# Patient Record
Sex: Male | Born: 1985 | Race: White | Hispanic: No | Marital: Single | Smoking: Never smoker
Health system: Southern US, Community
[De-identification: ages and names within clinical notes are randomized; demographics above are authoritative.]

## PROBLEM LIST (undated history)

## (undated) DIAGNOSIS — H5340 Unspecified visual field defects: Secondary | ICD-10-CM

## (undated) DIAGNOSIS — D6851 Activated protein C resistance: Secondary | ICD-10-CM

## (undated) DIAGNOSIS — F418 Other specified anxiety disorders: Secondary | ICD-10-CM

## (undated) DIAGNOSIS — S069X9A Unspecified intracranial injury with loss of consciousness of unspecified duration, initial encounter: Secondary | ICD-10-CM

## (undated) HISTORY — DX: Unspecified visual field defects: H53.40

## (undated) HISTORY — DX: Activated protein C resistance: D68.51

## (undated) HISTORY — PX: TONSILLECTOMY: SUR1361

## (undated) HISTORY — DX: Unspecified intracranial injury with loss of consciousness of unspecified duration, initial encounter: S06.9X9A

## (undated) HISTORY — DX: Other specified anxiety disorders: F41.8

---
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
## 2004-12-04 DIAGNOSIS — H7292 Unspecified perforation of tympanic membrane, left ear: Secondary | ICD-10-CM

## 2004-12-04 HISTORY — DX: Unspecified perforation of tympanic membrane, left ear: H72.92

## 2005-04-02 ENCOUNTER — Emergency Department (HOSPITAL_COMMUNITY): Admission: EM | Admit: 2005-04-02 | Discharge: 2005-04-02 | Payer: Self-pay | Admitting: Family Medicine

## 2005-04-06 ENCOUNTER — Ambulatory Visit: Payer: Self-pay | Admitting: Internal Medicine

## 2006-04-04 ENCOUNTER — Ambulatory Visit: Payer: Self-pay | Admitting: Internal Medicine

## 2006-07-23 ENCOUNTER — Ambulatory Visit: Payer: Self-pay | Admitting: Internal Medicine

## 2009-05-06 ENCOUNTER — Encounter: Payer: Self-pay | Admitting: Internal Medicine

## 2009-08-20 ENCOUNTER — Ambulatory Visit: Payer: Self-pay | Admitting: Internal Medicine

## 2009-08-20 DIAGNOSIS — F418 Other specified anxiety disorders: Secondary | ICD-10-CM | POA: Insufficient documentation

## 2009-09-07 ENCOUNTER — Ambulatory Visit: Payer: Self-pay | Admitting: Internal Medicine

## 2009-09-14 ENCOUNTER — Encounter: Payer: Self-pay | Admitting: Internal Medicine

## 2009-09-14 ENCOUNTER — Telehealth (INDEPENDENT_AMBULATORY_CARE_PROVIDER_SITE_OTHER): Payer: Self-pay | Admitting: *Deleted

## 2009-10-26 ENCOUNTER — Ambulatory Visit: Payer: Self-pay | Admitting: Internal Medicine

## 2009-11-09 ENCOUNTER — Ambulatory Visit: Payer: Self-pay | Admitting: Internal Medicine

## 2009-11-22 LAB — CONVERTED CEMR LAB
Glucose, 1 Hour GTT: 69 mg/dL
Glucose, Fasting: 96 mg/dL (ref 70–99)
Glucose, GTT - 3 Hour: 65 mg/dL

## 2010-03-11 ENCOUNTER — Telehealth: Payer: Self-pay | Admitting: Internal Medicine

## 2010-03-11 DIAGNOSIS — D6851 Activated protein C resistance: Secondary | ICD-10-CM

## 2010-03-15 ENCOUNTER — Ambulatory Visit: Payer: Self-pay | Admitting: Internal Medicine

## 2010-03-15 DIAGNOSIS — M542 Cervicalgia: Secondary | ICD-10-CM | POA: Insufficient documentation

## 2010-04-20 ENCOUNTER — Ambulatory Visit: Payer: Self-pay | Admitting: Internal Medicine

## 2010-04-20 DIAGNOSIS — R292 Abnormal reflex: Secondary | ICD-10-CM

## 2010-04-20 LAB — CONVERTED CEMR LAB
Blood in Urine, dipstick: NEGATIVE
Glucose, Urine, Semiquant: NEGATIVE
Nitrite: NEGATIVE
Protein, U semiquant: NEGATIVE
Specific Gravity, Urine: <1.005
WBC Urine, dipstick: NEGATIVE

## 2010-05-04 ENCOUNTER — Encounter: Payer: Self-pay | Admitting: Internal Medicine

## 2010-10-10 ENCOUNTER — Ambulatory Visit: Payer: Self-pay | Admitting: Internal Medicine

## 2010-10-10 DIAGNOSIS — I861 Scrotal varices: Secondary | ICD-10-CM

## 2010-10-13 ENCOUNTER — Encounter: Admission: RE | Admit: 2010-10-13 | Discharge: 2010-10-13 | Payer: Self-pay | Admitting: Internal Medicine

## 2010-10-14 ENCOUNTER — Telehealth: Payer: Self-pay | Admitting: Internal Medicine

## 2010-10-19 ENCOUNTER — Ambulatory Visit: Payer: Self-pay | Admitting: Internal Medicine

## 2010-10-20 ENCOUNTER — Telehealth: Payer: Self-pay | Admitting: Internal Medicine

## 2010-10-21 ENCOUNTER — Ambulatory Visit (HOSPITAL_COMMUNITY)
Admission: RE | Admit: 2010-10-21 | Discharge: 2010-10-21 | Payer: Self-pay | Source: Home / Self Care | Admitting: Internal Medicine

## 2010-10-31 ENCOUNTER — Telehealth: Payer: Self-pay | Admitting: Internal Medicine

## 2010-11-14 ENCOUNTER — Encounter: Payer: Self-pay | Admitting: Internal Medicine

## 2010-11-16 ENCOUNTER — Telehealth: Payer: Self-pay | Admitting: Internal Medicine

## 2010-11-17 ENCOUNTER — Telehealth: Payer: Self-pay | Admitting: Internal Medicine

## 2010-11-24 ENCOUNTER — Ambulatory Visit (HOSPITAL_COMMUNITY)
Admission: RE | Admit: 2010-11-24 | Discharge: 2010-11-24 | Payer: Self-pay | Source: Home / Self Care | Attending: Internal Medicine | Admitting: Internal Medicine

## 2010-11-30 ENCOUNTER — Encounter
Admission: RE | Admit: 2010-11-30 | Discharge: 2011-01-03 | Payer: Self-pay | Source: Home / Self Care | Attending: Physical Medicine and Rehabilitation | Admitting: Physical Medicine and Rehabilitation

## 2010-12-01 ENCOUNTER — Encounter: Payer: Self-pay | Admitting: Internal Medicine

## 2010-12-21 ENCOUNTER — Telehealth: Payer: Self-pay | Admitting: Internal Medicine

## 2011-01-04 ENCOUNTER — Ambulatory Visit: Payer: 59 | Attending: Internal Medicine | Admitting: Rehabilitative and Restorative Service Providers"

## 2011-01-04 DIAGNOSIS — M546 Pain in thoracic spine: Secondary | ICD-10-CM | POA: Insufficient documentation

## 2011-01-04 DIAGNOSIS — M542 Cervicalgia: Secondary | ICD-10-CM | POA: Insufficient documentation

## 2011-01-04 DIAGNOSIS — IMO0001 Reserved for inherently not codable concepts without codable children: Secondary | ICD-10-CM | POA: Insufficient documentation
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
## 2011-01-05 NOTE — Letter (Signed)
Summary: [facility] MRI[facility]   Imported By: Lanelle Bal 05/11/2010 13:01:47  _____________________________________________________________________  External Attachment:    Type:   Image     Comment:   External Document

## 2011-01-05 NOTE — Progress Notes (Signed)
Summary: Referral request  Phone Note Call from Patient Call back at Home Phone [phone]   Summary of Call: Patient called noting that he would like referral to go to [facility]. Patient has seen [facility] and is aware he needs a nerve conduction study. Ok to enter referral? Initial call taken by: Lucious Groves CMA,  December 21, 2010 4:57 PM  Follow-up for Phone Call        no, test  recommneded  by Dr Ethelene Hal, he needs to order it  if he deems that [facility] E. Myles Mallicoat MD  December 21, 2010 8:08 PM   Additional Follow-up for Phone Call Additional follow up Details #1::        Patient notified. Additional Follow-up by: Lucious Groves CMA,  December 22, 2010 9:25 AM

## 2011-01-05 NOTE — Progress Notes (Signed)
Summary: fyi-- has FACTOR V DEFICIENCY  Phone Note Call from Patient   Caller: Mom Summary of Call: Mom here for CPX -- wanted to let us know that pt had tested positive for factor V liden.  His sister had a PE 11/2010 & test positive for factor V, protein S Shary Decamp  March 11, 2010 3:12 PM   New Problems: FACTOR V DEFICIENCY (ICD-286.3)   New Problems: FACTOR V DEFICIENCY (ICD-286.3)
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
## 2011-01-05 NOTE — Letter (Signed)
Summary: f/u 1 month, Dr Ethelene Hal ---[facility]   Imported By: Lanelle Bal 12/13/2010 08:41:54  _____________________________________________________________________  External Attachment:    Type:   Image     Comment:   External Document

## 2011-01-05 NOTE — Progress Notes (Signed)
Summary: wants MRI of back also  Phone Note Call from Patient Call back at Home Phone [phone]   Caller: Patient Summary of Call: patient has decided to get MRI of his back--has talked to his orthopedist  please give him a referral to the same [facility] location that he went to before for his neck MRI--please call him at [phone] Initial call taken by: Jerolyn Shin,  November 17, 2010 11:47 AM  Follow-up for Phone Call        Pls advise ok to order MRI of thoracic and is it with or w/o contrast. pls advise.Marland KitchenMarland KitchenMarland KitchenFelecia Deloach CMA  November 17, 2010 12:36 PM   w/o contrast [place] E. Paz MD  November 17, 2010 12:46 PM   Additional Follow-up for Phone Call Additional follow up Details #1::        Pt aware orders put in...........Marland KitchenFelecia Deloach CMA  November 17, 2010 1:10 PM

## 2011-01-05 NOTE — Progress Notes (Signed)
Summary: MRI   Phone Note Call from Patient Call back at Home Phone [phone]   Caller: Patient Summary of Call: I spoke with pt and he would like to know more detail about the MRI. He states that he is in severe pain so he knows that there has to be something wrong. Please advise. Army Fossa CMA  October 31, 2010 4:51 PM   Follow-up for Phone Call        MRI failed to show a problem that would explain  his pain. Needs to discuss w/ ortho Jose E. Paz MD  October 31, 2010 5:29 PM   Additional Follow-up for Phone Call Additional follow up Details #1::        Left message for pt to call back. Army Fossa CMA  November 01, 2010 8:19 AM     Additional Follow-up for Phone Call Additional follow up Details #2::    I spoke with pt he is aware. Army Fossa CMA  November 01, 2010 9:00 AM
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
## 2011-01-05 NOTE — Letter (Signed)
Summary: [facility]  [facility]   Imported By: Lanelle Bal 11/24/2010 11:19:10  _____________________________________________________________________  External Attachment:    Type:   Image     Comment:   External Document

## 2011-01-05 NOTE — Progress Notes (Signed)
Summary: Disability paperwork  Phone Note Call from Patient Call back at Home Phone [phone]   Summary of Call: Patient left message on triage that he needs to bring disability paper to the office.   Left message on voicemail notifying the patient to bring paperwork on Monday.   Initial call taken by: Lucious Groves CMA,  October 14, 2010 5:03 PM
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
## 2011-01-05 NOTE — Progress Notes (Signed)
Summary: Return Paper  Phone Note Outgoing Call   Summary of Call: Dr Drue Novel had given me a blank paper for the patient with note that said "Give back to patient". I called the patient to notify and patient said that the MD will not be doing paperwork for him and to shred it.  *Done Initial call taken by: Lucious Groves CMA,  October 20, 2010 4:58 PM

## 2011-01-09 ENCOUNTER — Ambulatory Visit: Payer: 59 | Admitting: Rehabilitative and Restorative Service Providers"

## 2011-01-11 ENCOUNTER — Encounter: Payer: Self-pay | Admitting: Rehabilitative and Restorative Service Providers"

## 2011-01-16 ENCOUNTER — Encounter: Payer: Self-pay | Admitting: Rehabilitative and Restorative Service Providers"

## 2011-01-19 ENCOUNTER — Encounter: Payer: Self-pay | Admitting: Rehabilitative and Restorative Service Providers"

## 2011-04-12 ENCOUNTER — Encounter: Payer: Self-pay | Admitting: Internal Medicine

## 2011-05-11 ENCOUNTER — Encounter: Payer: Self-pay | Admitting: Internal Medicine

## 2011-05-12 ENCOUNTER — Encounter: Payer: Self-pay | Admitting: Internal Medicine

## 2011-12-05 HISTORY — PX: VASECTOMY: SHX75

## 2013-03-26 ENCOUNTER — Ambulatory Visit: Payer: 59 | Admitting: Family Medicine

## 2013-04-02 ENCOUNTER — Ambulatory Visit (INDEPENDENT_AMBULATORY_CARE_PROVIDER_SITE_OTHER): Payer: BC Managed Care – PPO | Admitting: Internal Medicine

## 2013-04-02 ENCOUNTER — Encounter: Payer: Self-pay | Admitting: Internal Medicine

## 2013-04-02 VITALS — BP 118/78 | HR 57 | Wt 145.0 lb

## 2013-04-02 DIAGNOSIS — F418 Other specified anxiety disorders: Secondary | ICD-10-CM

## 2013-04-02 DIAGNOSIS — F341 Dysthymic disorder: Secondary | ICD-10-CM

## 2013-04-02 MED ORDER — CLONAZEPAM 0.5 MG PO TABS: 0.5000 mg | ORAL_TABLET | Freq: Two times a day (BID) | ORAL | Status: DC | PRN

## 2013-04-02 NOTE — Assessment & Plan Note (Signed)
This is a chronic issue, in 2011 we tried Effexor, Celexa and Zoloft, nothing brought clear relief. He had some thoughts of violence but clearly stated that he is not a violent person and has no plans to hurt himself or others. I talked about a counselor and he is not open to that idea, states that he had a lot of counseling before and didn't help. I recommend a psychiatrist referral, he is willing to go, I think that is necessary because 2 years ago I tried a number of meds without much success. Plan: Clonazepam as needed,warned about drowsiness. Psychiatry referral For further management Hotline for crisis provided.
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
## 2013-04-02 NOTE — Patient Instructions (Addendum)
Take the medicine as needed Call anytime  if you get worse  A hotline is available to you:  [phone]  or [phone]

## 2013-06-10 ENCOUNTER — Encounter: Payer: Self-pay | Admitting: Family Medicine

## 2013-06-10 ENCOUNTER — Ambulatory Visit (INDEPENDENT_AMBULATORY_CARE_PROVIDER_SITE_OTHER): Payer: BC Managed Care – PPO | Admitting: Family Medicine

## 2013-06-10 VITALS — BP 112/72 | HR 60 | Temp 98.0°F | Ht 67.5 in | Wt 146.0 lb

## 2013-06-10 DIAGNOSIS — Z23 Encounter for immunization: Secondary | ICD-10-CM

## 2013-06-10 DIAGNOSIS — Z0000 Encounter for general adult medical examination without abnormal findings: Secondary | ICD-10-CM

## 2013-06-10 DIAGNOSIS — D492 Neoplasm of unspecified behavior of bone, soft tissue, and skin: Secondary | ICD-10-CM

## 2013-06-10 DIAGNOSIS — R229 Localized swelling, mass and lump, unspecified: Secondary | ICD-10-CM

## 2013-06-10 NOTE — Assessment & Plan Note (Signed)
Anticipate verrucae, less likely seborrheic keratosis. Discussed treatment options. Will monitor for now, and update me if spreading or more bothersome for imiquimod, consider LN2 cryotherapy.

## 2013-06-10 NOTE — Addendum Note (Signed)
Addended by: Eliezer Bottom on: 06/10/2013 04:35 PM   Modules accepted: Orders

## 2013-06-10 NOTE — Patient Instructions (Addendum)
Tdap today Start hepatitis A series today. Good to meet you, call us with questions. Return at your convenience fasting for blood work.

## 2013-06-10 NOTE — Assessment & Plan Note (Signed)
Preventative protocols reviewed and updated unless pt declined. Discussed healthy diet and lifestyle. Hep A and Tdap today.
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
## 2016-08-01 ENCOUNTER — Emergency Department (HOSPITAL_COMMUNITY): Payer: Worker's Compensation

## 2016-08-01 ENCOUNTER — Emergency Department (HOSPITAL_COMMUNITY)
Admission: EM | Admit: 2016-08-01 | Discharge: 2016-08-01 | Disposition: A | Discharge status: Home / Self Care | Payer: Worker's Compensation | Attending: Emergency Medicine | Admitting: Emergency Medicine

## 2016-08-01 ENCOUNTER — Encounter (HOSPITAL_COMMUNITY): Payer: Self-pay | Admitting: Emergency Medicine

## 2016-08-01 DIAGNOSIS — M545 Low back pain, unspecified: Secondary | ICD-10-CM

## 2016-08-01 DIAGNOSIS — M5441 Lumbago with sciatica, right side: Secondary | ICD-10-CM

## 2016-08-01 MED ORDER — OXYCODONE-ACETAMINOPHEN 5-325 MG PO TABS: 1.0000 | ORAL_TABLET | ORAL | 0 refills | Status: DC | PRN

## 2016-08-01 MED ORDER — OXYCODONE-ACETAMINOPHEN 5-325 MG PO TABS
ORAL_TABLET | ORAL | Status: AC
  Filled 2016-08-01: qty 1

## 2016-08-01 MED ORDER — KETOROLAC TROMETHAMINE 30 MG/ML IJ SOLN
30.0000 mg | Freq: Once | INTRAMUSCULAR | Status: AC
  Administered 2016-08-01: 30 mg via INTRAVENOUS
  Filled 2016-08-01: qty 1

## 2016-08-01 MED ORDER — DEXAMETHASONE SODIUM PHOSPHATE 10 MG/ML IJ SOLN
10.0000 mg | Freq: Once | INTRAMUSCULAR | Status: AC
  Administered 2016-08-01: 10 mg via INTRAMUSCULAR
  Filled 2016-08-01: qty 1

## 2016-08-01 MED ORDER — FENTANYL CITRATE (PF) 100 MCG/2ML IJ SOLN
150.0000 ug | Freq: Once | INTRAMUSCULAR | Status: AC
  Administered 2016-08-01: 150 ug via INTRAVENOUS
  Filled 2016-08-01: qty 4

## 2016-08-01 MED ORDER — OXYCODONE-ACETAMINOPHEN 5-325 MG PO TABS
1.0000 | ORAL_TABLET | ORAL | Status: AC | PRN
  Administered 2016-08-01 (×2): 1 via ORAL
  Filled 2016-08-01: qty 1
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
## 2016-08-01 NOTE — ED Notes (Signed)
States not allergic to percocet.

## 2016-08-01 NOTE — ED Notes (Signed)
Pt ambulated 80 feet in the hall without difficulty at this time.  MD aware of pt's ambulation.  Pt provided a urine specimen at this time as well.  Specimen labelled at the bedside.  Pt in no apparent distress at this time.  Pt states that he feels ambulating "feels better than laying in a prone position."

## 2016-08-01 NOTE — ED Provider Notes (Signed)
Results for orders placed or performed in visit on 04/20/10  [facility] Lab  Result Value Ref Range   Glucose, Urine, Semiquant negative    Bilirubin Urine negative    Ketones, urine, test strip negative    Blood in Urine, dipstick negative    Protein, U semiquant negative    Urobilinogen, UA 0.2    Nitrite negative    WBC Urine, dipstick negative    Specific Gravity, Urine <1.005    pH 5.0    Mr Thoracic Spine Wo Contrast  Result Date: 08/01/2016 CLINICAL DATA:  Initial evaluation for acute low back pain following lifting and air conditioner Unit. Pain radiates to lower extremity. EXAM: MRI THORACIC AND LUMBAR SPINE WITHOUT CONTRAST TECHNIQUE: Multiplanar and multiecho pulse sequences of the thoracic and lumbar spine were obtained without intravenous contrast. COMPARISON:  None. FINDINGS: MRI THORACIC SPINE FINDINGS Alignment: Vertebral bodies normally aligned with preservation of the normal thoracic kyphosis. No listhesis. Vertebrae: Small chronic endplate Schmorl's node present at the superior endplate of T6 with associated minimal height loss. Vertebral body heights are otherwise maintained. No acute or subacute fracture. Signal intensity within the vertebral body bone marrow is normal. Few scattered benign hemangiomas noted, most notable within the T10 vertebral body. No worrisome osseous lesions. Cord:  Signal intensity within the thoracic spinal cord is normal. Paraspinal and other soft tissues: Paraspinous soft tissues demonstrate no acute abnormality. Visualized lungs are clear. Partially visualized visceral structures demonstrate no acute abnormality. Disc levels: Central disc protrusion at C6-7 indents and flattens the ventral thecal sac and abuts the thoracic spinal cord. Minimal cord flattening without cord signal changes. No significant stenosis. Tiny left paracentral disc protrusion at T7-8 without stenosis. No other significant degenerative changes within the thoracic spine. MRI  LUMBAR SPINE FINDINGS Segmentation: Normal segmentation. Lowest well-formed this is the L5-S1 level. Alignment: Vertebral bodies normally aligned with preservation of the normal lumbar lordosis. No listhesis. Vertebrae: Vertebral body heights well maintained. No acute or chronic fracture. Signal intensity within the vertebral body bone marrow is normal. No worrisome osseous lesions. Conus medullaris: Extends to the L1 level and appears normal. Paraspinal and other soft tissues: Paraspinous soft tissues demonstrate a normal appearance without acute abnormality. Disc levels: No significant degenerative changes are seen through the L4-5 level. L5-S1: Small central disc protrusion with slight caudad angulation. No significant stenosis or neural impingement. Foramina remain widely patent. IMPRESSION: MR THORACIC SPINE IMPRESSION 1. No acute traumatic injury within the thoracic spine. 2. Small disc protrusions at T6-7 and T7-8 without significant stenosis. 3. Otherwise normal appearance of the thoracic spine. MR LUMBAR SPINE IMPRESSION 1. No acute traumatic injury within the lumbar spine. 2. Small central disc protrusion at L5-S1 without significant stenosis or evidence for neural impingement. 3. Otherwise normal appearance of the lumbar spine. Electronically Signed   By: Jeannine Boga M.D.   On: 08/01/2016 18:45   Mr Lumbar Spine Wo Contrast  Result Date: 08/01/2016 CLINICAL DATA:  Initial evaluation for acute low back pain following lifting and air conditioner Unit. Pain radiates to lower extremity. EXAM: MRI THORACIC AND LUMBAR SPINE WITHOUT CONTRAST TECHNIQUE: Multiplanar and multiecho pulse sequences of the thoracic and lumbar spine were obtained without intravenous contrast. COMPARISON:  None. FINDINGS: MRI THORACIC SPINE FINDINGS Alignment: Vertebral bodies normally aligned with preservation of the normal thoracic kyphosis. No listhesis. Vertebrae: Small chronic endplate Schmorl's node present at the  superior endplate of T6 with associated minimal height loss. Vertebral body heights are otherwise maintained. No  acute or subacute fracture. Signal intensity within the vertebral body bone marrow is normal. Few scattered benign hemangiomas noted, most notable within the T10 vertebral body. No worrisome osseous lesions. Cord:  Signal intensity within the thoracic spinal cord is normal. Paraspinal and other soft tissues: Paraspinous soft tissues demonstrate no acute abnormality. Visualized lungs are clear. Partially visualized visceral structures demonstrate no acute abnormality. Disc levels: Central disc protrusion at C6-7 indents and flattens the ventral thecal sac and abuts the thoracic spinal cord. Minimal cord flattening without cord signal changes. No significant stenosis. Tiny left paracentral disc protrusion at T7-8 without stenosis. No other significant degenerative changes within the thoracic spine. MRI LUMBAR SPINE FINDINGS Segmentation: Normal segmentation. Lowest well-formed this is the L5-S1 level. Alignment: Vertebral bodies normally aligned with preservation of the normal lumbar lordosis. No listhesis. Vertebrae: Vertebral body heights well maintained. No acute or chronic fracture. Signal intensity within the vertebral body bone marrow is normal. No worrisome osseous lesions. Conus medullaris: Extends to the L1 level and appears normal. Paraspinal and other soft tissues: Paraspinous soft tissues demonstrate a normal appearance without acute abnormality. Disc levels: No significant degenerative changes are seen through the L4-5 level. L5-S1: Small central disc protrusion with slight caudad angulation. No significant stenosis or neural impingement. Foramina remain widely patent. IMPRESSION: MR THORACIC SPINE IMPRESSION 1. No acute traumatic injury within the thoracic spine. 2. Small disc protrusions at T6-7 and T7-8 without significant stenosis. 3. Otherwise normal appearance of the thoracic spine. MR  LUMBAR SPINE IMPRESSION 1. No acute traumatic injury within the lumbar spine. 2. Small central disc protrusion at L5-S1 without significant stenosis or evidence for neural impingement. 3. Otherwise normal appearance of the lumbar spine. Electronically Signed   By: Jeannine Boga M.D.   On: 08/01/2016 18:45   MRI does not show any significant spinal cord impingement. Patient is feeling better. He has no numbness. He still has pain to his low back with some radiation down his right leg. He has no numbness in his legs. He's able to ambulate. He has urinated. He does have some pain in his right shoulder going down his arm. He has some intermittent tingling in his right arm. This could be resulting from a nerve impingement in the shoulder. He has mild pain with range of motion of the shoulder. 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 I don't feel that it needs x-rays. He did not fall on it. He has no spinal tenderness to his cervical spine. He was given a referral to follow-up with orthopedics. He was given a shot of Decadron in the ED. He was given a prescription for Percocet to use at home.   Malvin Johns, MD 08/01/16 [phone]

## 2016-08-01 NOTE — ED Triage Notes (Signed)
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 Pain 10/10 sharp pain.

## 2016-08-01 NOTE — ED Notes (Signed)
Patient transported to MRI 

## 2016-08-01 NOTE — ED Notes (Signed)
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

## 2016-08-01 NOTE — ED Notes (Signed)
Pt enquired about a work note and restrictions upon his return to work.  Will notify MD of his requests.

## 2016-08-01 NOTE — ED Notes (Signed)
Pt provided with d/c instructions at this time. Pt verbalizes understanding of d/c instructions as well as follow up procedure after d/c.  Pt provided with RX for percocet.  Pt verbalizes understanding of RX directions. Pt in no apparent distress at this time. Pt ambulatory at time of d/c.

## 2017-02-01 ENCOUNTER — Other Ambulatory Visit: Payer: Self-pay | Admitting: Student

## 2017-02-01 DIAGNOSIS — M5412 Radiculopathy, cervical region: Secondary | ICD-10-CM

## 2017-02-01 DIAGNOSIS — M5416 Radiculopathy, lumbar region: Secondary | ICD-10-CM

## 2017-02-10 ENCOUNTER — Ambulatory Visit
Admission: RE | Admit: 2017-02-10 | Discharge: 2017-02-10 | Disposition: A | Discharge status: Home / Self Care | Payer: Worker's Compensation | Source: Ambulatory Visit | Attending: Student | Admitting: Student

## 2017-02-10 DIAGNOSIS — M5416 Radiculopathy, lumbar region: Secondary | ICD-10-CM

## 2017-02-10 DIAGNOSIS — M5412 Radiculopathy, cervical region: Secondary | ICD-10-CM

## 2017-03-15 IMAGING — MR MR LUMBAR SPINE W/O CM
4 of 5 series · 26 of 48 positions shown · non-contrast
Comparison: None.

CLINICAL DATA: Severe spinal pain. Lumbar radiculopathy. Pain
extends into both legs. Bilateral lower extremity weakness.

EXAM:
MRI LUMBAR SPINE WITHOUT CONTRAST
TECHNIQUE: Multiplanar, multisequence MR imaging of the lumbar spine was
performed. No intravenous contrast was administered.

[Series 3: T2 · sagittal · 4.0mm · 0.55mm/px · 6 of 13 slices shown (1 of 2)]
[im 1/13]
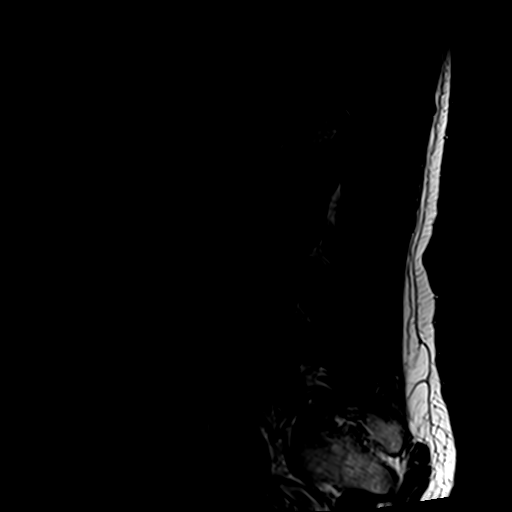
[im 3/13]
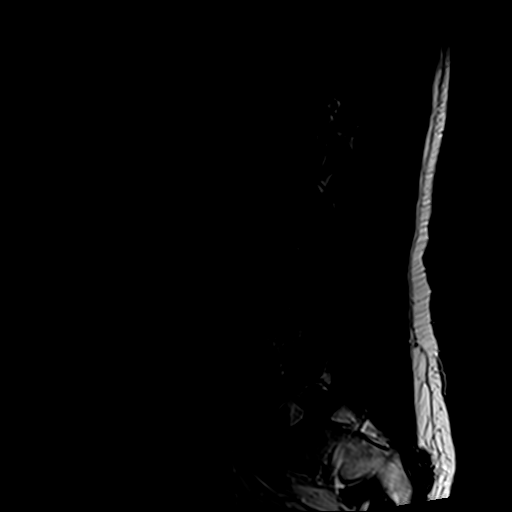
[im 5/13]
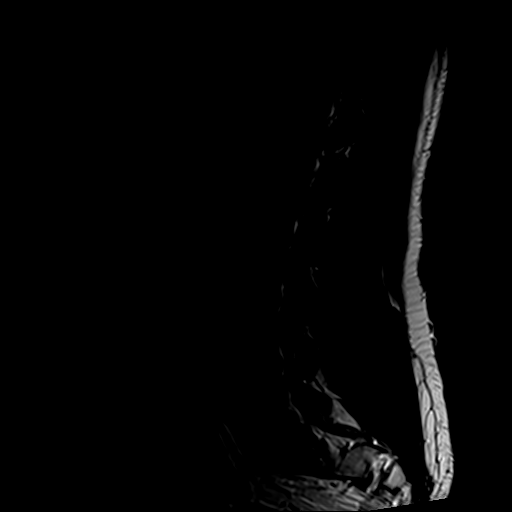
[im 8/13]
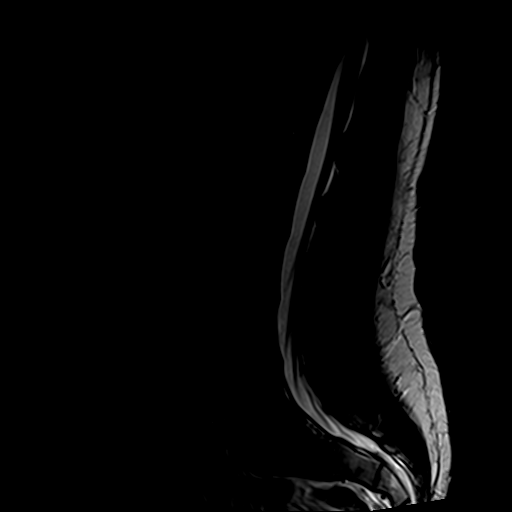
[im 10/13]
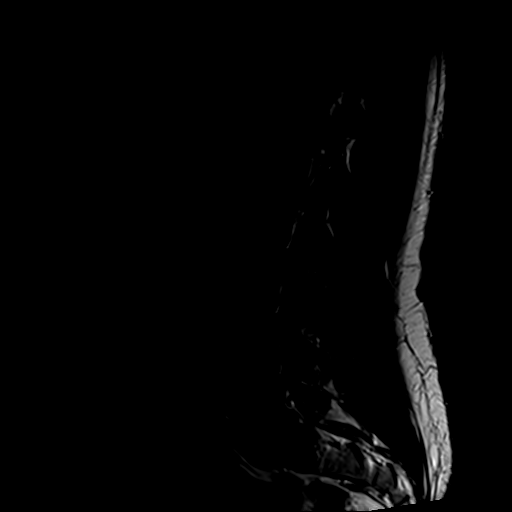
[im 13/13]
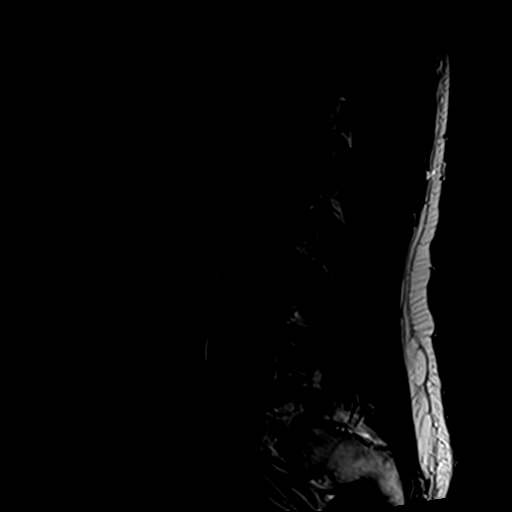

[Series 4: T1 · sagittal · 4.0mm · 0.55mm/px · 6 of 13 slices shown (1 of 2)]
[im 1/13]
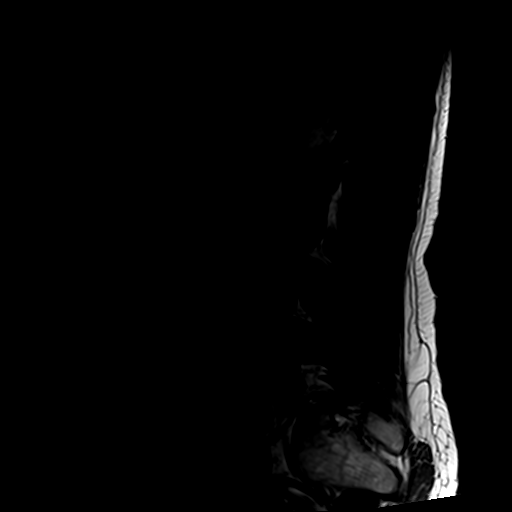
[im 3/13]
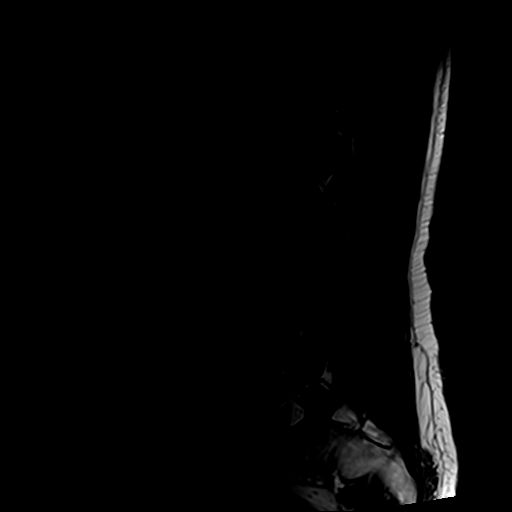
[im 5/13]
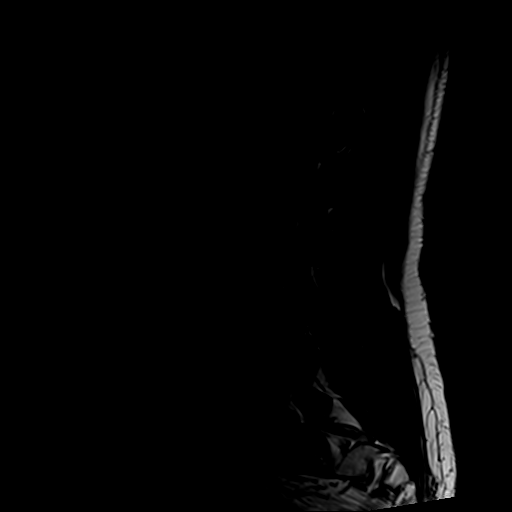
[im 8/13]
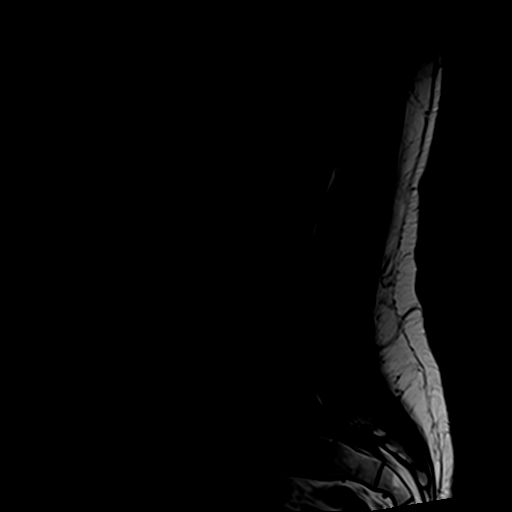
[im 10/13]
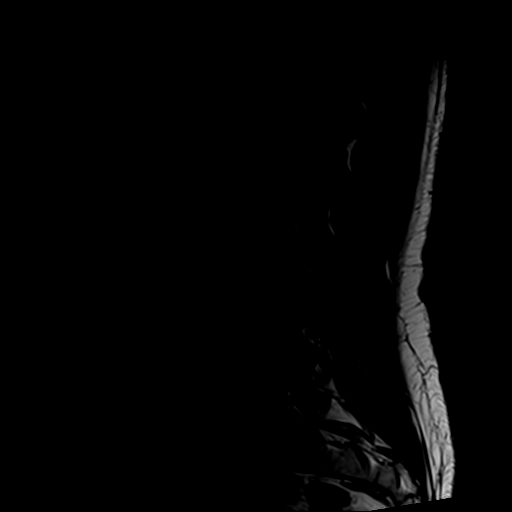
[im 13/13]
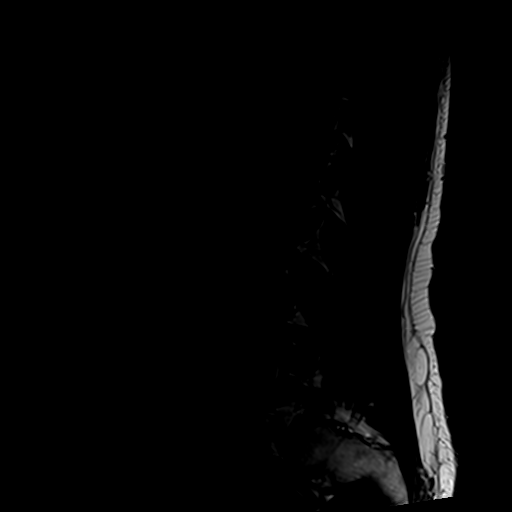

[Series 6: T2 · axial · 4.0mm · 0.70mm/px · z∈[-89,+101]mm · 9 of 33 slices shown (2 of 2)]
[im 1/33]
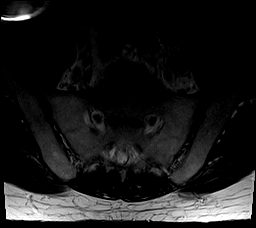
[im 5/33]
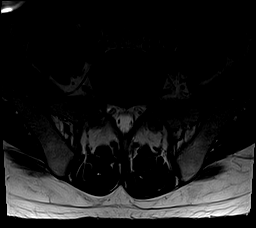
[im 10/33]
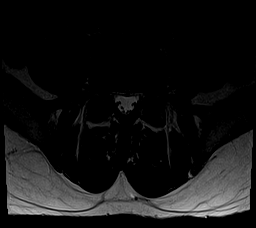
[im 14/33]
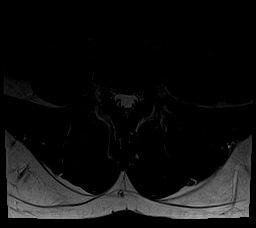
[im 17/33]
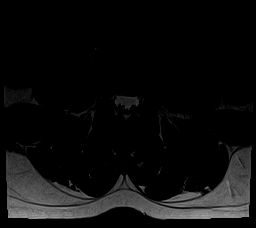
[im 19/33]
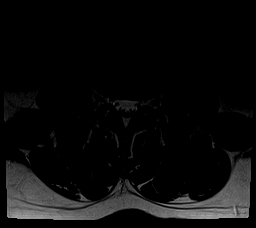
[im 23/33]
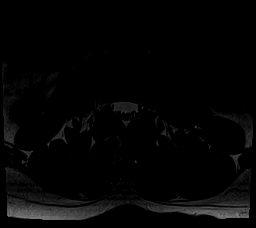
[im 28/33]
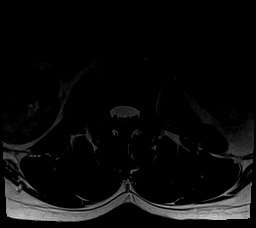
[im 33/33]
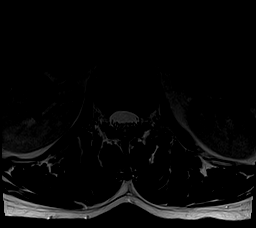

[Series 7: T1 · axial · 4.0mm · 0.35mm/px · z∈[-89,+75]mm · 5 of 33 slices shown (2 of 2)]
[im 1/33]
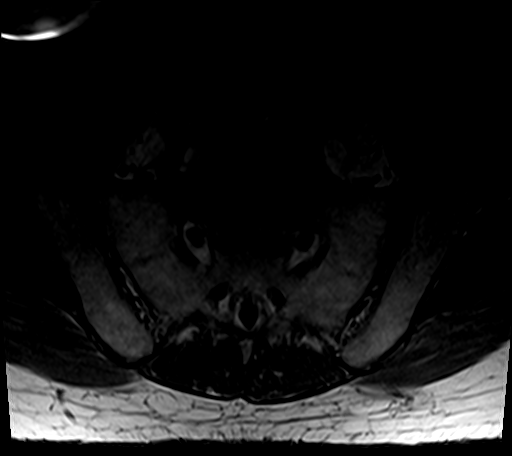
[im 5/33]
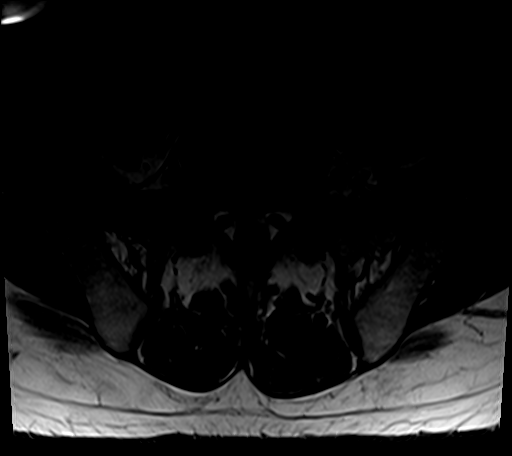
[im 10/33]
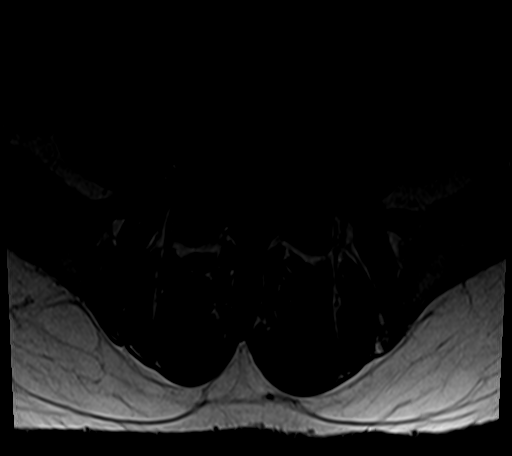
[im 17/33]
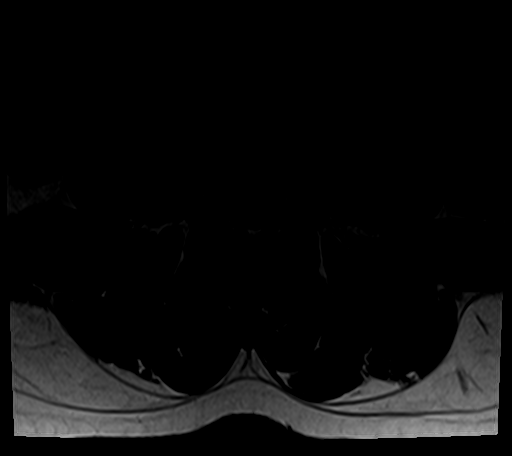
[im 28/33]
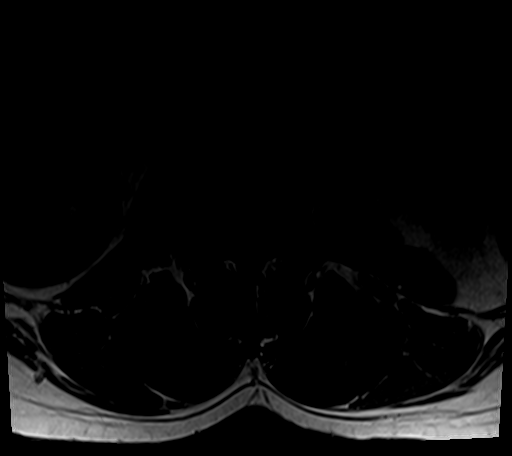

[26 of 48 positions shown; findings below may reference images not displayed]

FINDINGS: Segmentation: 5 non rib-bearing lumbar type vertebral bodies are
present.

Alignment: AP alignment is anatomic. No significant scoliosis is
present.

Vertebrae:  Marrow signal and vertebral body heights are normal.

Conus medullaris: Extends to the T12-L1: Level and appears normal.

Paraspinal and other soft tissues: Limited imaging the abdomen is
unremarkable.

Disc levels:

The disc levels at L1-2 and above are normal.

L3-4: Minimal disc bulging is present without significant stenosis.
Disc signal is preserved.

L4-5: Mild disc bulging is present bilaterally. There is no focal
stenosis.

L5-S1: A shallow central disc protrusion is present. There is no
focal stenosis.
IMPRESSION: 1. Mild disc disease at L3-4, L4-5, and L5-S1 without significant
focal stenosis.

## 2018-07-17 ENCOUNTER — Telehealth: Payer: Self-pay | Admitting: Family Medicine

## 2018-07-17 NOTE — Telephone Encounter (Signed)
Left message asking pt to call office    Dr. Darnell Level says if the pt means check up as in CPE, he will need a 30 min appt.  But if not, 15 min OV is fine   What is the reason for visit?

## 2018-07-18 NOTE — Telephone Encounter (Signed)
Left message asking pt to call office  °

## 2018-07-18 NOTE — Telephone Encounter (Signed)
Left message asking pt to call offfice °

## 2018-07-19 ENCOUNTER — Encounter: Payer: Self-pay | Admitting: Family Medicine

## 2018-07-19 ENCOUNTER — Ambulatory Visit: Payer: BC Managed Care – PPO | Admitting: Family Medicine

## 2018-07-19 VITALS — BP 114/72 | HR 74 | Temp 97.8°F | Ht 68.0 in | Wt 168.5 lb

## 2018-07-19 DIAGNOSIS — Z23 Encounter for immunization: Secondary | ICD-10-CM

## 2018-07-19 DIAGNOSIS — F418 Other specified anxiety disorders: Secondary | ICD-10-CM | POA: Diagnosis not present

## 2018-07-19 DIAGNOSIS — D6851 Activated protein C resistance: Secondary | ICD-10-CM

## 2018-07-19 DIAGNOSIS — T148XXA Other injury of unspecified body region, initial encounter: Secondary | ICD-10-CM | POA: Diagnosis not present

## 2018-07-19 NOTE — Assessment & Plan Note (Signed)
Chronic, stable on current regimen, followed closely by Dr Toy Care. Appreciate psych care.

## 2018-07-19 NOTE — Telephone Encounter (Signed)
Douglas Hughes  I wanted to let you know I have tried several time to call pt.

## 2018-07-19 NOTE — Addendum Note (Signed)
Addended by: Brenton Grills on: 2/48/2500 37:04 AM   Modules accepted: Orders

## 2018-07-19 NOTE — Patient Instructions (Addendum)
Good to see you today Hep A to complete series today Td today. You are doing well today Return as needed or in 1 year for a physical with labs.  Health Maintenance, Male A healthy lifestyle and preventive care is important for your health and wellness. Ask your health care provider about what schedule of regular examinations is right for you. What should I know about weight and diet? Eat a Healthy Diet  Eat plenty of vegetables, fruits, whole grains, low-fat dairy products, and lean protein.  Do not eat a lot of foods high in solid fats, added sugars, or salt.  Maintain a Healthy Weight Regular exercise can help you achieve or maintain a healthy weight. You should:  Do at least 150 minutes of exercise each week. The exercise should increase your heart rate and make you sweat (moderate-intensity exercise).  Do strength-training exercises at least twice a week.  Watch Your Levels of Cholesterol and Blood Lipids  Have your blood tested for lipids and cholesterol every 5 years starting at 33 years of age. If you are at high risk for heart disease, you should start having your blood tested when you are 33 years old. You may need to have your cholesterol levels checked more often if: ? Your lipid or cholesterol levels are high. ? You are older than 33 years of age. ? You are at high risk for heart disease.  What should I know about cancer screening? Many types of cancers can be detected early and may often be prevented. Lung Cancer  You should be screened every year for lung cancer if: ? You are a current smoker who has smoked for at least 30 years. ? You are a former smoker who has quit within the past 15 years.  Talk to your health care provider about your screening options, when you should start screening, and how often you should be screened.  Colorectal Cancer  Routine colorectal cancer screening usually begins at 33 years of age and should be repeated every 5-10 years until  you are 33 years old. You may need to be screened more often if early forms of precancerous polyps or small growths are found. Your health care provider may recommend screening at an earlier age if you have risk factors for colon cancer.  Your health care provider may recommend using home test kits to check for hidden blood in the stool.  A small camera at the end of a tube can be used to examine your colon (sigmoidoscopy or colonoscopy). This checks for the earliest forms of colorectal cancer.  Prostate and Testicular Cancer  Depending on your age and overall health, your health care provider may do certain tests to screen for prostate and testicular cancer.  Talk to your health care provider about any symptoms or concerns you have about testicular or prostate cancer.  Skin Cancer  Check your skin from head to toe regularly.  Tell your health care provider about any new moles or changes in moles, especially if: ? There is a change in a mole's size, shape, or color. ? You have a mole that is larger than a pencil eraser.  Always use sunscreen. Apply sunscreen liberally and repeat throughout the day.  Protect yourself by wearing long sleeves, pants, a wide-brimmed hat, and sunglasses when outside.  What should I know about heart disease, diabetes, and high blood pressure?  If you are 11-64 years of age, have your blood pressure checked every 3-5 years. If you are 40  years of age or older, have your blood pressure checked every year. You should have your blood pressure measured twice-once when you are at a hospital or clinic, and once when you are not at a hospital or clinic. Record the average of the two measurements. To check your blood pressure when you are not at a hospital or clinic, you can use: ? An automated blood pressure machine at a pharmacy. ? A home blood pressure monitor.  Talk to your health care provider about your target blood pressure.  If you are between 66-79 years  old, ask your health care provider if you should take aspirin to prevent heart disease.  Have regular diabetes screenings by checking your fasting blood sugar level. ? If you are at a normal weight and have a low risk for diabetes, have this test once every three years after the age of 22. ? If you are overweight and have a high risk for diabetes, consider being tested at a younger age or more often.  A one-time screening for abdominal aortic aneurysm (AAA) by ultrasound is recommended for men aged 71-75 years who are current or former smokers. What should I know about preventing infection? Hepatitis B If you have a higher risk for hepatitis B, you should be screened for this virus. Talk with your health care provider to find out if you are at risk for hepatitis B infection. Hepatitis C Blood testing is recommended for:  Everyone born from [birth year] through 1965.  Anyone with known risk factors for hepatitis C.  Sexually Transmitted Diseases (STDs)  You should be screened each year for STDs including gonorrhea and chlamydia if: ? You are sexually active and are younger than 33 years of age. ? You are older than 33 years of age and your health care provider tells you that you are at risk for this type of infection. ? Your sexual activity has changed since you were last screened and you are at an increased risk for chlamydia or gonorrhea. Ask your health care provider if you are at risk.  Talk with your health care provider about whether you are at high risk of being infected with HIV. Your health care provider may recommend a prescription medicine to help prevent HIV infection.  What else can I do?  Schedule regular health, dental, and eye exams.  Stay current with your vaccines (immunizations).  Do not use any tobacco products, such as cigarettes, chewing tobacco, and e-cigarettes. If you need help quitting, ask your health care provider.  Limit alcohol intake to no more than 2 drinks  per day. One drink equals 12 ounces of beer, 5 ounces of wine, or 1 ounces of hard liquor.  Do not use street drugs.  Do not share needles.  Ask your health care provider for help if you need support or information about quitting drugs.  Tell your health care provider if you often feel depressed.  Tell your health care provider if you have ever been abused or do not feel safe at home. This information is not intended to replace advice given to you by your health care provider. Make sure you discuss any questions you have with your health care provider. Document Released: 05/18/2008 Document Revised: 07/19/2016 Document Reviewed: 08/24/2015 Elsevier Interactive Patient Education  [employer].

## 2018-07-19 NOTE — Telephone Encounter (Signed)
Left message asking pt to call office  °

## 2018-07-19 NOTE — Telephone Encounter (Signed)
Noted. Pt seen in office today.

## 2018-07-19 NOTE — Assessment & Plan Note (Signed)
Discussed immunizations. Overall up to date. Will finish Hep A series today. Pt would like Td updated today - states he gets frequent cuts and scrapes with rusty metal at work. Latest cut was last week to R forearm.
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
## 2019-07-16 ENCOUNTER — Other Ambulatory Visit: Payer: Self-pay | Admitting: Family Medicine

## 2019-07-16 DIAGNOSIS — D6851 Activated protein C resistance: Secondary | ICD-10-CM

## 2019-07-16 DIAGNOSIS — Z13220 Encounter for screening for lipoid disorders: Secondary | ICD-10-CM

## 2019-07-16 DIAGNOSIS — Z131 Encounter for screening for diabetes mellitus: Secondary | ICD-10-CM

## 2019-07-18 ENCOUNTER — Other Ambulatory Visit: Payer: Self-pay

## 2019-07-21 ENCOUNTER — Other Ambulatory Visit: Payer: Self-pay

## 2019-07-23 ENCOUNTER — Encounter: Payer: Self-pay | Admitting: Family Medicine

## 2019-08-18 ENCOUNTER — Encounter: Payer: Self-pay | Admitting: Family Medicine

## 2019-08-18 ENCOUNTER — Other Ambulatory Visit: Payer: Self-pay

## 2019-08-18 ENCOUNTER — Ambulatory Visit (INDEPENDENT_AMBULATORY_CARE_PROVIDER_SITE_OTHER): Payer: BC Managed Care – PPO | Admitting: Family Medicine

## 2019-08-18 VITALS — BP 118/68 | HR 77 | Temp 98.1°F | Ht 67.0 in | Wt 169.2 lb

## 2019-08-18 DIAGNOSIS — Z23 Encounter for immunization: Secondary | ICD-10-CM

## 2019-08-18 DIAGNOSIS — Z0000 Encounter for general adult medical examination without abnormal findings: Secondary | ICD-10-CM | POA: Diagnosis not present

## 2019-08-18 DIAGNOSIS — F418 Other specified anxiety disorders: Secondary | ICD-10-CM

## 2019-08-18 DIAGNOSIS — Z13220 Encounter for screening for lipoid disorders: Secondary | ICD-10-CM | POA: Diagnosis not present

## 2019-08-18 DIAGNOSIS — Z131 Encounter for screening for diabetes mellitus: Secondary | ICD-10-CM | POA: Diagnosis not present

## 2019-08-18 DIAGNOSIS — D6851 Activated protein C resistance: Secondary | ICD-10-CM | POA: Diagnosis not present

## 2019-08-18 NOTE — Patient Instructions (Signed)
Flu shot today Labs today You are doing well today Continue healthy diet and lifestyle. Return as needed or in 1-2 years for next physical.   Health Maintenance, Male Adopting a healthy lifestyle and getting preventive care are important in promoting health and wellness. Ask your health care provider about:  The right schedule for you to have regular tests and exams.  Things you can do on your own to prevent diseases and keep yourself healthy. What should I know about diet, weight, and exercise? Eat a healthy diet   Eat a diet that includes plenty of vegetables, fruits, low-fat dairy products, and lean protein.  Do not eat a lot of foods that are high in solid fats, added sugars, or sodium. Maintain a healthy weight Body mass index (BMI) is a measurement that can be used to identify possible weight problems. It estimates body fat based on height and weight. Your health care provider can help determine your BMI and help you achieve or maintain a healthy weight. Get regular exercise Get regular exercise. This is one of the most important things you can do for your health. Most adults should:  Exercise for at least 150 minutes each week. The exercise should increase your heart rate and make you sweat (moderate-intensity exercise).  Do strengthening exercises at least twice a week. This is in addition to the moderate-intensity exercise.  Spend less time sitting. Even light physical activity can be beneficial. Watch cholesterol and blood lipids Have your blood tested for lipids and cholesterol at 34 years of age, then have this test every 5 years. You may need to have your cholesterol levels checked more often if:  Your lipid or cholesterol levels are high.  You are older than 34 years of age.  You are at high risk for heart disease. What should I know about cancer screening? Many types of cancers can be detected early and may often be prevented. Depending on your health history and  family history, you may need to have cancer screening at various ages. This may include screening for:  Colorectal cancer.  Prostate cancer.  Skin cancer.  Lung cancer. What should I know about heart disease, diabetes, and high blood pressure? Blood pressure and heart disease  High blood pressure causes heart disease and increases the risk of stroke. This is more likely to develop in people who have high blood pressure readings, are of African descent, or are overweight.  Talk with your health care provider about your target blood pressure readings.  Have your blood pressure checked: ? Every 3-5 years if you are 39-49 years of age. ? Every year if you are 83 years old or older.  If you are between the ages of 29 and 43 and are a current or former smoker, ask your health care provider if you should have a one-time screening for abdominal aortic aneurysm (AAA). Diabetes Have regular diabetes screenings. This checks your fasting blood sugar level. Have the screening done:  Once every three years after age 35 if you are at a normal weight and have a low risk for diabetes.  More often and at a younger age if you are overweight or have a high risk for diabetes. What should I know about preventing infection? Hepatitis B If you have a higher risk for hepatitis B, you should be screened for this virus. Talk with your health care provider to find out if you are at risk for hepatitis B infection. Hepatitis C Blood testing is recommended for:  Everyone born from [birth year] through 1966.  Anyone with known risk factors for hepatitis C. Sexually transmitted infections (STIs)  You should be screened each year for STIs, including gonorrhea and chlamydia, if: ? You are sexually active and are younger than 34 years of age. ? You are older than 34 years of age and your health care provider tells you that you are at risk for this type of infection. ? Your sexual activity has changed since you were  last screened, and you are at increased risk for chlamydia or gonorrhea. Ask your health care provider if you are at risk.  Ask your health care provider about whether you are at high risk for HIV. Your health care provider may recommend a prescription medicine to help prevent HIV infection. If you choose to take medicine to prevent HIV, you should first get tested for HIV. You should then be tested every 3 months for as long as you are taking the medicine. Follow these instructions at home: Lifestyle  Do not use any products that contain nicotine or tobacco, such as cigarettes, e-cigarettes, and chewing tobacco. If you need help quitting, ask your health care provider.  Do not use street drugs.  Do not share needles.  Ask your health care provider for help if you need support or information about quitting drugs. Alcohol use  Do not drink alcohol if your health care provider tells you not to drink.  If you drink alcohol: ? Limit how much you have to 0-2 drinks a day. ? Be aware of how much alcohol is in your drink. In the [place] one drink equals one 12 oz bottle of beer (355 mL), one 5 oz glass of wine (148 mL), or one 1 oz glass of hard liquor (44 mL). General instructions  Schedule regular health, dental, and eye exams.  Stay current with your vaccines.  Tell your health care provider if: ? You often feel depressed. ? You have ever been abused or do not feel safe at home. Summary  Adopting a healthy lifestyle and getting preventive care are important in promoting health and wellness.  Follow your health care provider's instructions about healthy diet, exercising, and getting tested or screened for diseases.  Follow your health care provider's instructions on monitoring your cholesterol and blood pressure. This information is not intended to replace advice given to you by your health care provider. Make sure you discuss any questions you have with your health care provider.  Document Released: 05/18/2008 Document Revised: 11/13/2018 Document Reviewed: 11/13/2018 Elsevier Patient Education  2020 [employer].

## 2019-08-18 NOTE — Assessment & Plan Note (Signed)
Preventative protocols reviewed and updated unless pt declined. Discussed healthy diet and lifestyle.  

## 2019-08-18 NOTE — Progress Notes (Signed)
This visit was conducted in person.  BP 118/68 (BP Location: Left Arm, Patient Position: Sitting, Cuff Size: Normal)   Pulse 77   Temp 98.1 F (36.7 C) (Temporal)   Ht 5\' 7"  (1.702 m)   Wt 169 lb 3 oz (76.7 kg)   SpO2 97%   BMI 26.50 kg/m    CC: CPE Subjective:    Patient ID: Douglas Hughes, male    DOB: June 11, 1986, 32 y.o.   MRN: [id number]  HPI: Douglas Hughes is a 33 y.o. 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  Followed by psychiatrist Dr Toy Care for depression/anxiety. Uses valium PRN, celexa daily.   Preventative: Flu shot today Tdap 2014, Td 2019 Seat belt use discussed Sunscreen use discussed, no changing moles on skin.  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 Interim medical history since our last visit reviewed. Allergies and medications reviewed and updated. Outpatient Medications Prior to Visit  Medication Sig Dispense Refill  . citalopram (CELEXA) 40 MG tablet Take 1 tablet by mouth daily.  12  . cyclobenzaprine (FLEXERIL) 10 MG tablet Take 1 tablet by mouth 2 (two) times daily as needed.  5  . diazepam (VALIUM) 10 MG tablet Take 10 mg by mouth at bedtime. As needed     No facility-administered medications prior to visit.      Per HPI unless specifically indicated in ROS section below Review of Systems  Constitutional: Negative for activity change, appetite change, chills, fatigue, fever and unexpected weight change.  HENT: Negative for hearing loss.   Eyes: Negative for visual disturbance.  Respiratory:  Negative for cough, chest tightness, shortness of breath and wheezing.   Cardiovascular: Negative for chest pain, palpitations and leg swelling.  Gastrointestinal: Negative for abdominal distention, abdominal pain, blood in stool, constipation, diarrhea, nausea and vomiting.  Genitourinary: Negative for difficulty urinating and hematuria.  Musculoskeletal: Negative for arthralgias, myalgias and neck pain.  Skin: Negative for rash.  Neurological: Negative for dizziness, seizures, syncope and headaches.  Hematological: Negative for adenopathy. Does not bruise/bleed easily.  Psychiatric/Behavioral: Negative for dysphoric mood. The patient is not nervous/anxious.    Objective:    BP 118/68 (BP Location: Left Arm, Patient Position: Sitting, Cuff Size: Normal)   Pulse 77   Temp 98.1 F (36.7 C) (Temporal)   Ht 5\' 7"  (1.702 m)   Wt 169 lb 3 oz (76.7 kg)   SpO2 97%   BMI 26.50 kg/m   Wt Readings from Last 3 Encounters:  08/18/19 169 lb 3 oz (76.7 kg)  07/19/18 168 lb 8 oz (76.4 kg)  08/01/16 150 lb (68 kg)    Physical Exam Vitals signs and nursing note reviewed.  Constitutional:      General: He is not in acute distress.    Appearance: Normal appearance. He is well-developed. He is not ill-appearing.  HENT:     Head: Normocephalic and atraumatic.     Right Ear: Hearing, tympanic membrane, ear canal and external ear normal.     Left Ear: Hearing, tympanic  membrane, ear canal and external ear normal.     Nose: Nose normal.     Mouth/Throat:     Mouth: Mucous membranes are moist.     Pharynx: Uvula midline. No oropharyngeal exudate or posterior oropharyngeal erythema.  Eyes:     General: No scleral icterus.    Extraocular Movements: Extraocular movements intact.     Conjunctiva/sclera: Conjunctivae normal.     Pupils: Pupils are equal, round, and reactive to light.  Neck:     Musculoskeletal: Normal range of motion and neck supple.  Cardiovascular:     Rate and Rhythm: Normal rate  and regular rhythm.     Pulses: Normal pulses.          Radial pulses are 2+ on the right side and 2+ on the left side.     Heart sounds: Normal heart sounds. No murmur.  Pulmonary:     Effort: Pulmonary effort is normal. No respiratory distress.     Breath sounds: Normal breath sounds. No wheezing, rhonchi or rales.  Abdominal:     General: Abdomen is flat. Bowel sounds are normal. There is no distension.     Palpations: Abdomen is soft. There is no mass.     Tenderness: There is no abdominal tenderness. There is no guarding or rebound.     Hernia: No hernia is present.  Musculoskeletal: Normal range of motion.  Lymphadenopathy:     Cervical: No cervical adenopathy.  Skin:    General: Skin is warm and dry.     Findings: No rash.  Neurological:     General: No focal deficit present.     Mental Status: He is alert and oriented to person, place, and time.     Comments: CN grossly intact, station and gait intact  Psychiatric:        Mood and Affect: Mood normal.        Behavior: Behavior normal.        Thought Content: Thought content normal.        Judgment: Judgment normal.       No results found for this or any previous visit. Assessment & Plan:   Problem List Items Addressed This Visit    Mixed anxiety and depressive disorder    Appreciate psych care Toy Care)      Healthcare maintenance - Primary    Preventative protocols reviewed and updated unless pt declined. Discussed healthy diet and lifestyle.       Factor V Leiden ([place])    Not on blood thinner.        Other Visit Diagnoses    Need for influenza vaccination       Relevant Orders   Flu Vaccine QUAD 36+ mos IM (Completed)   Diabetes mellitus screening       Relevant Orders   Basic metabolic panel   Lipid screening       Relevant Orders   Lipid panel       No orders of the defined types were placed in this encounter.  Orders Placed This Encounter  Procedures  . Flu Vaccine QUAD 36+ mos IM  . Lipid  panel  . Basic metabolic panel    Follow up plan: Return in about 1 year (around 08/17/2020) for annual exam, prior fasting for blood work.  Ria Bush, MD

## 2019-08-18 NOTE — Assessment & Plan Note (Signed)
Not on blood thinner 

## 2019-08-18 NOTE — Assessment & Plan Note (Signed)
Appreciate psych care Toy Care)

## 2019-08-19 LAB — BASIC METABOLIC PANEL
BUN: 12 mg/dL (ref 6–23)
CO2: 33 mEq/L — ABNORMAL HIGH (ref 19–32)
Calcium: 10 mg/dL (ref 8.4–10.5)
Chloride: 99 mEq/L (ref 96–112)
Creatinine, Ser: 0.91 mg/dL (ref 0.40–1.50)
GFR: 95.13 mL/min (ref >60.00)
Glucose, Bld: 80 mg/dL (ref 70–99)
Potassium: 4.8 mEq/L (ref 3.5–5.1)
Sodium: 138 mEq/L (ref 135–145)

## 2019-08-19 LAB — LIPID PANEL
Cholesterol: 217 mg/dL — ABNORMAL HIGH (ref 0–200)
HDL: 47.3 mg/dL (ref >39.00)
NonHDL: 169.61
Total CHOL/HDL Ratio: 5
Triglycerides: 201 mg/dL — ABNORMAL HIGH (ref 0.0–149.0)
VLDL: 40.2 mg/dL — ABNORMAL HIGH (ref 0.0–40.0)

## 2019-08-19 LAB — LDL CHOLESTEROL, DIRECT: Direct LDL: 123 mg/dL

## 2020-09-13 ENCOUNTER — Encounter: Payer: BC Managed Care – PPO | Admitting: Family Medicine

## 2020-09-22 ENCOUNTER — Encounter: Payer: Self-pay | Admitting: Family Medicine

## 2020-09-22 ENCOUNTER — Other Ambulatory Visit: Payer: Self-pay

## 2020-09-22 ENCOUNTER — Ambulatory Visit (INDEPENDENT_AMBULATORY_CARE_PROVIDER_SITE_OTHER): Payer: BC Managed Care – PPO | Admitting: Family Medicine

## 2020-09-22 VITALS — BP 108/70 | HR 71 | Temp 98.3°F | Ht 67.75 in | Wt 181.6 lb

## 2020-09-22 DIAGNOSIS — Z0000 Encounter for general adult medical examination without abnormal findings: Secondary | ICD-10-CM | POA: Diagnosis not present

## 2020-09-22 DIAGNOSIS — R079 Chest pain, unspecified: Secondary | ICD-10-CM | POA: Diagnosis not present

## 2020-09-22 DIAGNOSIS — F418 Other specified anxiety disorders: Secondary | ICD-10-CM | POA: Diagnosis not present

## 2020-09-22 DIAGNOSIS — E785 Hyperlipidemia, unspecified: Secondary | ICD-10-CM

## 2020-09-22 NOTE — Patient Instructions (Addendum)
Good to see you today You are doing well. Return as needed or in 1 year for next physical  Return at your convenience for fasting labs (4 hours) - may schedule up front. May come in at 4pm one day.   Health Maintenance, Male Adopting a healthy lifestyle and getting preventive care are important in promoting health and wellness. Ask your health care provider about:  The right schedule for you to have regular tests and exams.  Things you can do on your own to prevent diseases and keep yourself healthy. What should I know about diet, weight, and exercise? Eat a healthy diet   Eat a diet that includes plenty of vegetables, fruits, low-fat dairy products, and lean protein.  Do not eat a lot of foods that are high in solid fats, added sugars, or sodium. Maintain a healthy weight Body mass index (BMI) is a measurement that can be used to identify possible weight problems. It estimates body fat based on height and weight. Your health care provider can help determine your BMI and help you achieve or maintain a healthy weight. Get regular exercise Get regular exercise. This is one of the most important things you can do for your health. Most adults should:  Exercise for at least 150 minutes each week. The exercise should increase your heart rate and make you sweat (moderate-intensity exercise).  Do strengthening exercises at least twice a week. This is in addition to the moderate-intensity exercise.  Spend less time sitting. Even light physical activity can be beneficial. Watch cholesterol and blood lipids Have your blood tested for lipids and cholesterol at 35 years of age, then have this test every 5 years. You may need to have your cholesterol levels checked more often if:  Your lipid or cholesterol levels are high.  You are older than 35 years of age.  You are at high risk for heart disease. What should I know about cancer screening? Many types of cancers can be detected early and may  often be prevented. Depending on your health history and family history, you may need to have cancer screening at various ages. This may include screening for:  Colorectal cancer.  Prostate cancer.  Skin cancer.  Lung cancer. What should I know about heart disease, diabetes, and high blood pressure? Blood pressure and heart disease  High blood pressure causes heart disease and increases the risk of stroke. This is more likely to develop in people who have high blood pressure readings, are of African descent, or are overweight.  Talk with your health care provider about your target blood pressure readings.  Have your blood pressure checked: ? Every 3-5 years if you are 103-69 years of age. ? Every year if you are 72 years old or older.  If you are between the ages of 12 and 25 and are a current or former smoker, ask your health care provider if you should have a one-time screening for abdominal aortic aneurysm (AAA). Diabetes Have regular diabetes screenings. This checks your fasting blood sugar level. Have the screening done:  Once every three years after age 93 if you are at a normal weight and have a low risk for diabetes.  More often and at a younger age if you are overweight or have a high risk for diabetes. What should I know about preventing infection? Hepatitis B If you have a higher risk for hepatitis B, you should be screened for this virus. Talk with your health care provider to find out  if you are at risk for hepatitis B infection. Hepatitis C Blood testing is recommended for:  Everyone born from [birth year] through 1966.  Anyone with known risk factors for hepatitis C. Sexually transmitted infections (STIs)  You should be screened each year for STIs, including gonorrhea and chlamydia, if: ? You are sexually active and are younger than 35 years of age. ? You are older than 35 years of age and your health care provider tells you that you are at risk for this type of  infection. ? Your sexual activity has changed since you were last screened, and you are at increased risk for chlamydia or gonorrhea. Ask your health care provider if you are at risk.  Ask your health care provider about whether you are at high risk for HIV. Your health care provider may recommend a prescription medicine to help prevent HIV infection. If you choose to take medicine to prevent HIV, you should first get tested for HIV. You should then be tested every 3 months for as long as you are taking the medicine. Follow these instructions at home: Lifestyle  Do not use any products that contain nicotine or tobacco, such as cigarettes, e-cigarettes, and chewing tobacco. If you need help quitting, ask your health care provider.  Do not use street drugs.  Do not share needles.  Ask your health care provider for help if you need support or information about quitting drugs. Alcohol use  Do not drink alcohol if your health care provider tells you not to drink.  If you drink alcohol: ? Limit how much you have to 0-2 drinks a day. ? Be aware of how much alcohol is in your drink. In the [place] one drink equals one 12 oz bottle of beer (355 mL), one 5 oz glass of wine (148 mL), or one 1 oz glass of hard liquor (44 mL). General instructions  Schedule regular health, dental, and eye exams.  Stay current with your vaccines.  Tell your health care provider if: ? You often feel depressed. ? You have ever been abused or do not feel safe at home. Summary  Adopting a healthy lifestyle and getting preventive care are important in promoting health and wellness.  Follow your health care provider's instructions about healthy diet, exercising, and getting tested or screened for diseases.  Follow your health care provider's instructions on monitoring your cholesterol and blood pressure. This information is not intended to replace advice given to you by your health care provider. Make sure you  discuss any questions you have with your health care provider. Document Revised: 11/13/2018 Document Reviewed: 11/13/2018 Elsevier Patient Education  2020 [employer].

## 2020-09-22 NOTE — Assessment & Plan Note (Signed)
Will return when fasting for labwork.

## 2020-09-22 NOTE — Assessment & Plan Note (Signed)
Anticipate stress vs caffeine related - rec decrease caffeine intake and update if persistent.

## 2020-09-22 NOTE — Assessment & Plan Note (Signed)
Followed by psychiatrist Toy Care)

## 2020-09-22 NOTE — Progress Notes (Signed)
This visit was conducted in person.  BP 108/70 (BP Location: Left Arm, Patient Position: Sitting, Cuff Size: Normal)   Pulse 71   Temp 98.3 F (36.8 C) (Temporal)   Ht 5' 7.75" (1.721 m)   Wt 181 lb 9 oz (82.4 kg)   SpO2 98%   BMI 27.81 kg/m    CC: CPE Subjective:    Patient ID: Douglas Hughes, male    DOB: [birth year], 34 y.o.   MRN: [id number]  HPI: Douglas Hughes is a 34 y.o. male presenting on 09/22/2020 for Annual Exam and Chest Pain (C/o constant left left side ches pain. Not having today.  Started about 2 wks ago. )   Followed by psychiatrist Dr Toy Care for depression/anxiety. Uses valium TID PRN, now off celexa and on cymbalta with remeron for sleep. 12 lb weight gain from last year.   2 mo wk h/o sharp pains to L chest, can last all morning. Doesn't think stress related. Denies nausea, dyspnea, diaphoresis, radiation to jaw or down arm. No inciting etiology noted. Drinking 5-6 + cups coffee/day.   Preventative: Flu shot yearly  Tdap 2014, Td 2019  [place] 02/2020 x2, 08/2020  Seat belt use discussed Sunscreen use discussed, no changing moles on skin.  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 Interim medical history since our last visit reviewed. Allergies and medications reviewed and updated. Outpatient Medications Prior to Visit  Medication Sig Dispense Refill  . diazepam (VALIUM) 10 MG tablet Take 10 mg by mouth at bedtime. As needed    . DULoxetine (CYMBALTA) 30 MG capsule Take 30 mg by mouth every morning.    . mirtazapine (REMERON) 30 MG tablet  Take 30 mg by mouth at bedtime.    . citalopram (CELEXA) 40 MG tablet Take 1 tablet by mouth daily.  12  . cyclobenzaprine (FLEXERIL) 10 MG tablet Take 1 tablet by mouth 2 (two) times daily as needed.  5   No facility-administered medications prior to visit.     Per HPI unless specifically indicated in ROS section below Review of Systems  Constitutional: Negative for activity change, appetite change, chills, fatigue, fever and unexpected weight change.  HENT: Negative for hearing loss.   Eyes: Negative for visual disturbance.  Respiratory: Negative for cough, chest tightness, shortness of breath and wheezing.   Cardiovascular: Positive for chest pain. Negative for palpitations and leg swelling.  Gastrointestinal: Positive for constipation (PRN laxative). Negative for abdominal distention, abdominal pain, blood in stool, diarrhea, nausea and vomiting.  Genitourinary: Negative for difficulty urinating and hematuria.  Musculoskeletal: Negative for arthralgias, myalgias and neck pain.  Skin: Negative for rash.  Neurological: Negative for dizziness, seizures, syncope and headaches.  Hematological: Negative for adenopathy. Does not bruise/bleed easily.  Psychiatric/Behavioral: Negative for dysphoric mood. The patient is not nervous/anxious.    Objective:  BP 108/70 (BP Location: Left Arm, Patient Position: Sitting, Cuff Size: Normal)   Pulse 71   Temp 98.3 F (36.8 C) (Temporal)   Ht 5' 7.75" (1.721 m)   Wt 181 lb 9 oz (82.4 kg)  SpO2 98%   BMI 27.81 kg/m   Wt Readings from Last 3 Encounters:  09/22/20 181 lb 9 oz (82.4 kg)  08/18/19 169 lb 3 oz (76.7 kg)  07/19/18 168 lb 8 oz (76.4 kg)      Physical Exam Vitals and nursing note reviewed.  Constitutional:      General: He is not in acute distress.    Appearance: Normal appearance. He is not ill-appearing.  HENT:     Head: Normocephalic and atraumatic.     Right Ear: Hearing, tympanic membrane, ear canal and external ear  normal.     Left Ear: Hearing, tympanic membrane, ear canal and external ear normal.  Eyes:     General: No scleral icterus.    Extraocular Movements: Extraocular movements intact.     Conjunctiva/sclera: Conjunctivae normal.     Pupils: Pupils are equal, round, and reactive to light.  Neck:     Thyroid: No thyroid mass or thyromegaly.  Cardiovascular:     Rate and Rhythm: Normal rate and regular rhythm.     Pulses: Normal pulses.          Radial pulses are 2+ on the right side and 2+ on the left side.     Heart sounds: Normal heart sounds. No murmur heard.   Pulmonary:     Effort: Pulmonary effort is normal. No respiratory distress.     Breath sounds: Normal breath sounds. No wheezing, rhonchi or rales.  Abdominal:     General: Abdomen is flat. Bowel sounds are normal. There is no distension.     Palpations: Abdomen is soft. There is no mass.     Tenderness: There is no abdominal tenderness. There is no guarding or rebound.     Hernia: No hernia is present.  Musculoskeletal:        General: Normal range of motion.     Cervical back: Normal range of motion and neck supple.     Right lower leg: No edema.  Lymphadenopathy:     Cervical: No cervical adenopathy.  Skin:    General: Skin is warm and dry.     Findings: No rash.  Neurological:     General: No focal deficit present.     Mental Status: He is alert and oriented to person, place, and time.     Comments: CN grossly intact, station and gait intact  Psychiatric:        Mood and Affect: Mood normal.        Behavior: Behavior normal.        Thought Content: Thought content normal.        Judgment: Judgment normal.       Results for orders placed or performed in visit on 08/18/19  Lipid panel  Result Value Ref Range   Cholesterol 217 (H) 0 - 200 mg/dL   Triglycerides 201.0 (H) 0 - 149 mg/dL   HDL 47.30 >39.00 mg/dL   VLDL 40.2 (H) 0.0 - 40.0 mg/dL   Total CHOL/HDL Ratio 5    NonHDL 423.53   Basic metabolic panel    Result Value Ref Range   Sodium 138 135 - 145 mEq/L   Potassium 4.8 3.5 - 5.1 mEq/L   Chloride 99 96 - 112 mEq/L   CO2 33 (H) 19 - 32 mEq/L   Glucose, Bld 80 70 - 99 mg/dL   BUN 12 6 - 23 mg/dL   Creatinine, Ser 0.91 0.40 - 1.50 mg/dL   Calcium 10.0 8.4 -  10.5 mg/dL   GFR 95.13 >60.00 mL/min  LDL cholesterol, direct  Result Value Ref Range   Direct LDL 123.0 mg/dL   Assessment & Plan:  This visit occurred during the SARS-CoV-2 public health emergency.  Safety protocols were in place, including screening questions prior to the visit, additional usage of staff PPE, and extensive cleaning of exam room while observing appropriate contact time as indicated for disinfecting solutions.   Problem List Items Addressed This Visit    Mixed anxiety and depressive disorder    Followed by psychiatrist Toy Care)       Relevant Medications   DULoxetine (CYMBALTA) 30 MG capsule   mirtazapine (REMERON) 30 MG tablet   Healthcare maintenance - Primary    Preventative protocols reviewed and updated unless pt declined. Discussed healthy diet and lifestyle.       Dyslipidemia    Will return when fasting for labwork.       Relevant Orders   Basic metabolic panel   Lipid panel   TSH   Chest pain    Anticipate stress vs caffeine related - rec decrease caffeine intake and update if persistent.           No orders of the defined types were placed in this encounter.  Orders Placed This Encounter  Procedures  . Basic metabolic panel    Standing Status:   Future    Standing Expiration Date:   09/22/2021  . Lipid panel    Standing Status:   Future    Standing Expiration Date:   09/22/2021  . TSH    Standing Status:   Future    Standing Expiration Date:   09/22/2021    Patient instructions: Good to see you today You are doing well. Return as needed or in 1 year for next physical  Return at your convenience for fasting labs (4 hours) - may schedule up front. May come in at 4pm one day.    Follow up plan: Return in about 1 year (around 09/22/2021) for annual exam, prior fasting for blood work.  Ria Bush, MD

## 2020-09-22 NOTE — Assessment & Plan Note (Signed)
Preventative protocols reviewed and updated unless pt declined. Discussed healthy diet and lifestyle.  

## 2020-09-29 ENCOUNTER — Other Ambulatory Visit (INDEPENDENT_AMBULATORY_CARE_PROVIDER_SITE_OTHER): Payer: BC Managed Care – PPO

## 2020-09-29 ENCOUNTER — Other Ambulatory Visit: Payer: Self-pay

## 2020-09-29 DIAGNOSIS — E785 Hyperlipidemia, unspecified: Secondary | ICD-10-CM

## 2020-09-30 LAB — LIPID PANEL
Cholesterol: 227 mg/dL — ABNORMAL HIGH (ref 0–200)
HDL: 50.5 mg/dL (ref >39.00)
LDL Cholesterol: 155 mg/dL — ABNORMAL HIGH (ref 0–99)
NonHDL: 176.34
Total CHOL/HDL Ratio: 4
Triglycerides: 108 mg/dL (ref 0.0–149.0)
VLDL: 21.6 mg/dL (ref 0.0–40.0)

## 2020-09-30 LAB — BASIC METABOLIC PANEL
BUN: 8 mg/dL (ref 6–23)
CO2: 31 mEq/L (ref 19–32)
Calcium: 9.6 mg/dL (ref 8.4–10.5)
Chloride: 102 mEq/L (ref 96–112)
Creatinine, Ser: 0.93 mg/dL (ref 0.40–1.50)
GFR: 106.35 mL/min (ref >60.00)
Glucose, Bld: 87 mg/dL (ref 70–99)
Potassium: 4.7 mEq/L (ref 3.5–5.1)
Sodium: 140 mEq/L (ref 135–145)

## 2020-09-30 LAB — TSH: TSH: 2.11 u[IU]/mL (ref 0.35–4.50)

## 2021-02-04 ENCOUNTER — Other Ambulatory Visit: Payer: Self-pay

## 2021-02-04 ENCOUNTER — Ambulatory Visit
Admission: EM | Admit: 2021-02-04 | Discharge: 2021-02-04 | Disposition: A | Discharge status: Home / Self Care | Payer: BC Managed Care – PPO | Attending: Urgent Care | Admitting: Urgent Care

## 2021-02-04 ENCOUNTER — Encounter: Payer: Self-pay | Admitting: Urgent Care

## 2021-02-04 DIAGNOSIS — Z20822 Contact with and (suspected) exposure to covid-19: Secondary | ICD-10-CM

## 2021-02-04 DIAGNOSIS — R059 Cough, unspecified: Secondary | ICD-10-CM

## 2021-02-04 DIAGNOSIS — R52 Pain, unspecified: Secondary | ICD-10-CM

## 2021-02-04 DIAGNOSIS — B349 Viral infection, unspecified: Secondary | ICD-10-CM

## 2021-02-04 MED ORDER — CETIRIZINE HCL 10 MG PO TABS: 10.0000 mg | ORAL_TABLET | Freq: Every day | ORAL | 0 refills | Status: DC

## 2021-02-04 MED ORDER — BENZONATATE 100 MG PO CAPS: 100.0000 mg | ORAL_CAPSULE | Freq: Three times a day (TID) | ORAL | 0 refills | Status: DC | PRN

## 2021-02-04 MED ORDER — PSEUDOEPHEDRINE HCL 60 MG PO TABS: 60.0000 mg | ORAL_TABLET | Freq: Three times a day (TID) | ORAL | 0 refills | Status: DC | PRN

## 2021-02-04 MED ORDER — PROMETHAZINE-DM 6.25-15 MG/5ML PO SYRP: 5.0000 mL | ORAL_SOLUTION | Freq: Every evening | ORAL | 0 refills | Status: DC | PRN
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
## 2021-02-04 NOTE — ED Triage Notes (Signed)
Pt c/o cough, sore throat, nasal congestion, sneezing, and body aches x2 days. Requesting covid test and will need a work note.

## 2021-02-04 NOTE — Discharge Instructions (Signed)

## 2021-02-05 LAB — NOVEL CORONAVIRUS, NAA: SARS-CoV-2, NAA: NOT DETECTED

## 2021-02-05 LAB — SARS-COV-2, NAA 2 DAY TAT

## 2021-09-14 ENCOUNTER — Other Ambulatory Visit: Payer: Self-pay

## 2021-09-14 ENCOUNTER — Encounter: Payer: Self-pay | Admitting: Family Medicine

## 2021-09-14 ENCOUNTER — Telehealth: Payer: Self-pay | Admitting: Family Medicine

## 2021-09-14 ENCOUNTER — Telehealth (INDEPENDENT_AMBULATORY_CARE_PROVIDER_SITE_OTHER): Payer: BC Managed Care – PPO | Admitting: Family Medicine

## 2021-09-14 DIAGNOSIS — J189 Pneumonia, unspecified organism: Secondary | ICD-10-CM | POA: Diagnosis not present

## 2021-09-14 DIAGNOSIS — J984 Other disorders of lung: Secondary | ICD-10-CM | POA: Insufficient documentation

## 2021-09-14 MED ORDER — AZITHROMYCIN 250 MG PO TABS: ORAL_TABLET | ORAL | 0 refills | Status: DC

## 2021-09-14 MED ORDER — PREDNISONE 20 MG PO TABS: ORAL_TABLET | ORAL | 0 refills | Status: DC

## 2021-09-14 MED ORDER — BENZONATATE 100 MG PO CAPS: 100.0000 mg | ORAL_CAPSULE | Freq: Three times a day (TID) | ORAL | 0 refills | Status: DC | PRN

## 2021-09-14 NOTE — Progress Notes (Signed)
Patient ID: Douglas Hughes, male    DOB: [birth year], 35 y.o.   MRN: [id number]  Virtual visit completed through [place], a video enabled telemedicine application. Due to national recommendations of social distancing due to COVID-19, a virtual visit is felt to be most appropriate for this patient at this time. Reviewed limitations, risks, security and privacy concerns of performing a virtual visit and the availability of in person appointments. I also reviewed that there may be a patient responsible charge related to this service. The patient agreed to proceed.   Patient location: home Provider location:  at [facility], office Persons participating in this virtual visit: patient, provider   If any vitals were documented, they were collected by patient at home unless specified below.    Ht 5' 7.75" (1.721 m)   Wt 175 lb (79.4 kg)   BMI 26.81 kg/m    CC: cough Subjective:   HPI: Douglas Hughes is a 35 y.o. male presenting on 09/14/2021 for Cough (C/o prod cough with white phlegm and sore throat, sxs worsening.  Sxs started 09/11/21.  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 )   4d h/o ST, hoarse voice, constant cough productive of white phlegm. The day prior to cough he was under his house working on [employer], [employer]. He was not wearing mas. Some chest congestion but overall feels well.   No fevers/chills, ear or tooth pain, head congestion, abd pain or nausea, dyspnea or wheezing.   H/o seasonal allergies this time of year.  No h/o asthma.  Non smoker.  Not around sick contacts.   Had to miss work yesterday and today due to cough. Requests work note.      Relevant past medical, surgical, family and social history reviewed and updated as indicated. Interim medical history since our last visit reviewed. Allergies and medications reviewed and updated. Outpatient Medications Prior to Visit  Medication Sig Dispense Refill   diazepam (VALIUM) 10 MG tablet Take  10 mg by mouth at bedtime. As needed     DULoxetine (CYMBALTA) 30 MG capsule Take 30 mg by mouth every morning.     mirtazapine (REMERON) 45 MG tablet Take 45 mg by mouth at bedtime.     benzonatate (TESSALON) 100 MG capsule Take 1-2 capsules (100-200 mg total) by mouth 3 (three) times daily as needed. 60 capsule 0   cetirizine (ZYRTEC ALLERGY) 10 MG tablet Take 1 tablet (10 mg total) by mouth daily. 30 tablet 0   mirtazapine (REMERON) 30 MG tablet Take 30 mg by mouth at bedtime.     promethazine-dextromethorphan (PROMETHAZINE-DM) 6.25-15 MG/5ML syrup Take 5 mLs by mouth at bedtime as needed for cough. 100 mL 0   pseudoephedrine (SUDAFED) 60 MG tablet Take 1 tablet (60 mg total) by mouth every 8 (eight) hours as needed for congestion. 30 tablet 0   No facility-administered medications prior to visit.     Per HPI unless specifically indicated in ROS section below Review of Systems Objective:  Ht 5' 7.75" (1.721 m)   Wt 175 lb (79.4 kg)   BMI 26.81 kg/m   Wt Readings from Last 3 Encounters:  09/14/21 175 lb (79.4 kg)  09/22/20 181 lb 9 oz (82.4 kg)  08/18/19 169 lb 3 oz (76.7 kg)       Physical exam: Gen: alert, NAD, not ill appearing Pulm: speaks in complete sentences with coughing fits present Psych: normal mood, normal thought content      Assessment & Plan:  Problem List Items Addressed This Visit     Acute pneumonitis    Anticipate acute pneumonitis after exposure to fiberglass under his house - was not wearing a mask. Treat with prednisone taper, tessalon perls, WASP for zpack provided with indications when to start. Update if not improving with treatment, to consider chest imaging. Pt agrees with plan.  Letter for work provided today.         Meds ordered this encounter  Medications   predniSONE (DELTASONE) 20 MG tablet    Sig: Take two tablets daily for 3 days followed by one tablet daily for 4 days    Dispense:  10 tablet    Refill:  0   benzonatate  (TESSALON) 100 MG capsule    Sig: Take 1 capsule (100 mg total) by mouth 3 (three) times daily as needed for cough.    Dispense:  30 capsule    Refill:  0   azithromycin (ZITHROMAX) 250 MG tablet    Sig: Take two tablets on day one followed by one tablet on days 2-5    Dispense:  6 each    Refill:  0   No orders of the defined types were placed in this encounter.   I discussed the assessment and treatment plan with the patient. The patient was provided an opportunity to ask questions and all were answered. The patient agreed with the plan and demonstrated an understanding of the instructions. The patient was advised to call back or seek an in-person evaluation if the symptoms worsen or if the condition fails to improve as anticipated.  Follow up plan: No follow-ups on file.  Ria Bush, MD

## 2021-09-14 NOTE — Assessment & Plan Note (Addendum)
Anticipate acute pneumonitis after exposure to fiberglass under his house - was not wearing a mask. Treat with prednisone taper, tessalon perls, WASP for zpack provided with indications when to start. Update if not improving with treatment, to consider chest imaging. Pt agrees with plan.  Letter for work provided today.

## 2021-09-15 NOTE — Telephone Encounter (Signed)
See other message

## 2021-09-15 NOTE — Telephone Encounter (Signed)
New letter written.  Lattie Haw do you know how to send via email?

## 2021-09-16 NOTE — Telephone Encounter (Signed)
Emailed to patient.

## 2021-09-16 NOTE — Telephone Encounter (Signed)
Douglas Hughes, would you be able to email the letter Dr. Darnell Level wrote in pt's chart yesterday to email address provided by pt?

## 2022-04-13 ENCOUNTER — Encounter: Payer: Self-pay | Admitting: Family Medicine

## 2022-04-13 ENCOUNTER — Other Ambulatory Visit: Payer: Self-pay | Admitting: Family Medicine

## 2022-04-13 DIAGNOSIS — Z1159 Encounter for screening for other viral diseases: Secondary | ICD-10-CM

## 2022-04-13 DIAGNOSIS — E785 Hyperlipidemia, unspecified: Secondary | ICD-10-CM

## 2022-04-13 DIAGNOSIS — D6851 Activated protein C resistance: Secondary | ICD-10-CM

## 2022-04-13 NOTE — Progress Notes (Signed)
Labs ordered.

## 2022-04-14 ENCOUNTER — Other Ambulatory Visit (INDEPENDENT_AMBULATORY_CARE_PROVIDER_SITE_OTHER): Payer: BC Managed Care – PPO

## 2022-04-14 DIAGNOSIS — Z1159 Encounter for screening for other viral diseases: Secondary | ICD-10-CM | POA: Diagnosis not present

## 2022-04-14 DIAGNOSIS — E785 Hyperlipidemia, unspecified: Secondary | ICD-10-CM

## 2022-04-14 LAB — LIPID PANEL
Cholesterol: 249 mg/dL — ABNORMAL HIGH (ref 0–200)
HDL: 42.5 mg/dL (ref >39.00)
NonHDL: 206.86
Total CHOL/HDL Ratio: 6
Triglycerides: 232 mg/dL — ABNORMAL HIGH (ref 0.0–149.0)
VLDL: 46.4 mg/dL — ABNORMAL HIGH (ref 0.0–40.0)

## 2022-04-14 LAB — COMPREHENSIVE METABOLIC PANEL
ALT: 31 U/L (ref 0–53)
AST: 17 U/L (ref 0–37)
Albumin: 4.4 g/dL (ref 3.5–5.2)
Alkaline Phosphatase: 77 U/L (ref 39–117)
BUN: 11 mg/dL (ref 6–23)
CO2: 29 mEq/L (ref 19–32)
Calcium: 9.9 mg/dL (ref 8.4–10.5)
Chloride: 102 mEq/L (ref 96–112)
Creatinine, Ser: 1.08 mg/dL (ref 0.40–1.50)
GFR: 87.93 mL/min (ref >60.00)
Glucose, Bld: 79 mg/dL (ref 70–99)
Potassium: 4.7 mEq/L (ref 3.5–5.1)
Sodium: 139 mEq/L (ref 135–145)
Total Bilirubin: 0.4 mg/dL (ref 0.2–1.2)
Total Protein: 7.2 g/dL (ref 6.0–8.3)

## 2022-04-14 LAB — LDL CHOLESTEROL, DIRECT: Direct LDL: 152 mg/dL

## 2022-04-17 ENCOUNTER — Ambulatory Visit (INDEPENDENT_AMBULATORY_CARE_PROVIDER_SITE_OTHER): Payer: BC Managed Care – PPO | Admitting: Family Medicine

## 2022-04-17 ENCOUNTER — Encounter: Payer: Self-pay | Admitting: Family Medicine

## 2022-04-17 VITALS — BP 118/78 | HR 85 | Temp 97.8°F | Ht 67.5 in | Wt 190.5 lb

## 2022-04-17 DIAGNOSIS — F418 Other specified anxiety disorders: Secondary | ICD-10-CM | POA: Diagnosis not present

## 2022-04-17 DIAGNOSIS — Z0000 Encounter for general adult medical examination without abnormal findings: Secondary | ICD-10-CM | POA: Diagnosis not present

## 2022-04-17 DIAGNOSIS — E785 Hyperlipidemia, unspecified: Secondary | ICD-10-CM | POA: Diagnosis not present

## 2022-04-17 DIAGNOSIS — R102 Pelvic and perineal pain: Secondary | ICD-10-CM | POA: Diagnosis not present

## 2022-04-17 DIAGNOSIS — D6851 Activated protein C resistance: Secondary | ICD-10-CM

## 2022-04-17 LAB — POC URINALSYSI DIPSTICK (AUTOMATED)
Bilirubin, UA: NEGATIVE
Blood, UA: NEGATIVE
Glucose, UA: NEGATIVE
Ketones, UA: NEGATIVE
Leukocytes, UA: NEGATIVE
Nitrite, UA: NEGATIVE
Protein, UA: NEGATIVE
Spec Grav, UA: <=1.005 — AB (ref 1.010–1.025)
Urobilinogen, UA: 0.2 E.U./dL
pH, UA: 6 (ref 5.0–8.0)

## 2022-04-17 LAB — HEPATITIS C ANTIBODY
Hepatitis C Ab: NONREACTIVE
SIGNAL TO CUT-OFF: 0.08 (ref <1.00)

## 2022-04-17 NOTE — Assessment & Plan Note (Signed)
Sees psychiatrist Dr Toy Care, on duloxetine '30mg'$  and remeron '45mg'$  daily ?

## 2022-04-17 NOTE — Assessment & Plan Note (Addendum)
Check UA today.  ?rec limit caffeine intake (significant).  ?

## 2022-04-17 NOTE — Assessment & Plan Note (Addendum)
Not on statin. Strong fmhx premature CAD. Discussed testing for lipoprotein (a), then consideration for statin. Reviewed diet choices to improve cholesterol levels.  ?The ASCVD Risk score (Arnett DK, et al., 2019) failed to calculate for the following reasons: ?  The 2019 ASCVD risk score is only valid for ages 34 to 70  ?
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
## 2022-04-17 NOTE — Patient Instructions (Addendum)
Urinalysis today. Avoid bladder irritants like caffeine, dark sodas, spicy foods. Drink plenty of water. Avoid caffeine.  ?We will check another cholesterol test next time. In interim, work on increased fiber and legumes in the diet to lower LDL bad cholesterol levels.  ?Schedule dental appointment.  ?Good to see you today. ?Return as needed or in 1 year for next physical.  ? ?Health Maintenance, Male ?Adopting a healthy lifestyle and getting preventive care are important in promoting health and wellness. Ask your health care provider about: ?The right schedule for you to have regular tests and exams. ?Things you can do on your own to prevent diseases and keep yourself healthy. ?What should I know about diet, weight, and exercise? ?Eat a healthy diet ? ?Eat a diet that includes plenty of vegetables, fruits, low-fat dairy products, and lean protein. ?Do not eat a lot of foods that are high in solid fats, added sugars, or sodium. ?Maintain a healthy weight ?Body mass index (BMI) is a measurement that can be used to identify possible weight problems. It estimates body fat based on height and weight. Your health care provider can help determine your BMI and help you achieve or maintain a healthy weight. ?Get regular exercise ?Get regular exercise. This is one of the most important things you can do for your health. Most adults should: ?Exercise for at least 150 minutes each week. The exercise should increase your heart rate and make you sweat (moderate-intensity exercise). ?Do strengthening exercises at least twice a week. This is in addition to the moderate-intensity exercise. ?Spend less time sitting. Even light physical activity can be beneficial. ?Watch cholesterol and blood lipids ?Have your blood tested for lipids and cholesterol at 37 years of age, then have this test every 5 years. ?You may need to have your cholesterol levels checked more often if: ?Your lipid or cholesterol levels are high. ?You are older than  37 years of age. ?You are at high risk for heart disease. ?What should I know about cancer screening? ?Many types of cancers can be detected early and may often be prevented. Depending on your health history and family history, you may need to have cancer screening at various ages. This may include screening for: ?Colorectal cancer. ?Prostate cancer. ?Skin cancer. ?Lung cancer. ?What should I know about heart disease, diabetes, and high blood pressure? ?Blood pressure and heart disease ?High blood pressure causes heart disease and increases the risk of stroke. This is more likely to develop in people who have high blood pressure readings or are overweight. ?Talk with your health care provider about your target blood pressure readings. ?Have your blood pressure checked: ?Every 3-5 years if you are 56-39 years of age. ?Every year if you are 52 years old or older. ?If you are between the ages of 40 and 44 and are a current or former smoker, ask your health care provider if you should have a one-time screening for abdominal aortic aneurysm (AAA). ?Diabetes ?Have regular diabetes screenings. This checks your fasting blood sugar level. Have the screening done: ?Once every three years after age 44 if you are at a normal weight and have a low risk for diabetes. ?More often and at a younger age if you are overweight or have a high risk for diabetes. ?What should I know about preventing infection? ?Hepatitis B ?If you have a higher risk for hepatitis B, you should be screened for this virus. Talk with your health care provider to find out if you are at  risk for hepatitis B infection. ?Hepatitis C ?Blood testing is recommended for: ?Everyone born from [birth year] through 1966. ?Anyone with known risk factors for hepatitis C. ?Sexually transmitted infections (STIs) ?You should be screened each year for STIs, including gonorrhea and chlamydia, if: ?You are sexually active and are younger than 37 years of age. ?You are older than 37  years of age and your health care provider tells you that you are at risk for this type of infection. ?Your sexual activity has changed since you were last screened, and you are at increased risk for chlamydia or gonorrhea. Ask your health care provider if you are at risk. ?Ask your health care provider about whether you are at high risk for HIV. Your health care provider may recommend a prescription medicine to help prevent HIV infection. If you choose to take medicine to prevent HIV, you should first get tested for HIV. You should then be tested every 3 months for as long as you are taking the medicine. ?Follow these instructions at home: ?Alcohol use ?Do not drink alcohol if your health care provider tells you not to drink. ?If you drink alcohol: ?Limit how much you have to 0-2 drinks a day. ?Know how much alcohol is in your drink. In the [place] one drink equals one 12 oz bottle of beer (355 mL), one 5 oz glass of wine (148 mL), or one 1? oz glass of hard liquor (44 mL). ?Lifestyle ?Do not use any products that contain nicotine or tobacco. These products include cigarettes, chewing tobacco, and vaping devices, such as e-cigarettes. If you need help quitting, ask your health care provider. ?Do not use street drugs. ?Do not share needles. ?Ask your health care provider for help if you need support or information about quitting drugs. ?General instructions ?Schedule regular health, dental, and eye exams. ?Stay current with your vaccines. ?Tell your health care provider if: ?You often feel depressed. ?You have ever been abused or do not feel safe at home. ?Summary ?Adopting a healthy lifestyle and getting preventive care are important in promoting health and wellness. ?Follow your health care provider's instructions about healthy diet, exercising, and getting tested or screened for diseases. ?Follow your health care provider's instructions on monitoring your cholesterol and blood pressure. ?This information is not  intended to replace advice given to you by your health care provider. Make sure you discuss any questions you have with your health care provider. ?Document Revised: 04/11/2021 Document Reviewed: 04/11/2021 ?Elsevier Patient Education ? [place]. ? ?

## 2022-04-17 NOTE — Assessment & Plan Note (Signed)
Preventative protocols reviewed and updated unless pt declined. Discussed healthy diet and lifestyle.  

## 2023-04-24 ENCOUNTER — Encounter: Payer: BC Managed Care – PPO | Admitting: Family Medicine

## 2024-01-07 ENCOUNTER — Encounter: Payer: Self-pay | Admitting: Family

## 2024-01-07 ENCOUNTER — Ambulatory Visit: Payer: 59 | Admitting: Family

## 2024-01-07 VITALS — BP 118/76 | HR 94 | Temp 97.8°F | Ht 69.0 in | Wt 193.0 lb

## 2024-01-07 DIAGNOSIS — J069 Acute upper respiratory infection, unspecified: Secondary | ICD-10-CM | POA: Insufficient documentation

## 2024-01-07 DIAGNOSIS — R051 Acute cough: Secondary | ICD-10-CM

## 2024-01-07 LAB — POCT RAPID STREP A (OFFICE): Rapid Strep A Screen: NEGATIVE

## 2024-01-07 MED ORDER — BENZONATATE 200 MG PO CAPS: 200.0000 mg | ORAL_CAPSULE | Freq: Two times a day (BID) | ORAL | 0 refills | Status: DC | PRN

## 2024-01-07 MED ORDER — PROMETHAZINE-DM 6.25-15 MG/5ML PO SYRP: 5.0000 mL | ORAL_SOLUTION | Freq: Four times a day (QID) | ORAL | 0 refills | Status: AC | PRN

## 2024-01-07 NOTE — Assessment & Plan Note (Signed)
 Strep negative Advised patient on supportive measures:  Be sure to rest, drink plenty of fluids, and use tylenol or ibuprofen as needed for pain. Follow up if fever >101, if symptoms worsen or if symptoms are not improved in 3 days. Patient verbalizes understanding.

## 2024-01-07 NOTE — Progress Notes (Signed)
Established Patient Office Visit  Subjective:   Patient ID: Douglas Hughes, male    DOB: [birth year]  Age: 38 y.o. MRN: [id number]  CC:  Chief Complaint  Patient presents with   Acute Visit    Reports cough, congestion, sore throat. Denies fever, body aches or chills. Feels much better than he did over the weekend.    HPI: [name] is a 38 y.o. male presenting on 01/07/2024 for Acute Visit (Reports cough, congestion, sore throat. Denies fever, body aches or chills. Feels much better than he did over the weekend.)  Five days ago started with a cough that progressed to worsening cough, raspy sore throat, coughing so much at night not able to sleep, increased sputum productive white in nature. Friday night was shivering even with two blankets on.   This am starting to feel a touch better.  No n/v/d  Taking some otc meds as needed.      ROS: Negative unless specifically indicated above in HPI.   Relevant past medical history reviewed and updated as indicated.   Allergies and medications reviewed and updated.   Current Outpatient Medications:    benzonatate (TESSALON) 200 MG capsule, Take 1 capsule (200 mg total) by mouth 2 (two) times daily as needed for cough., Disp: 20 capsule, Rfl: 0   DULoxetine (CYMBALTA) 30 MG capsule, Take 30 mg by mouth every morning., Disp: , Rfl:    mirtazapine (REMERON) 45 MG tablet, Take 45 mg by mouth at bedtime., Disp: , Rfl:    promethazine-dextromethorphan (PROMETHAZINE-DM) 6.25-15 MG/5ML syrup, Take 5 mLs by mouth 4 (four) times daily as needed for up to 5 days for cough., Disp: 100 mL, Rfl: 0  Allergies  Allergen Reactions   Codeine    Sulfonamide Derivatives    Penicillins Hives and Rash    Has patient had a PCN reaction causing immediate rash, facial/tongue/throat swelling, SOB or lightheadedness with hypotension: Yes Has patient had a PCN reaction causing severe rash involving mucus membranes or skin necrosis: No Has patient had  a PCN reaction that required hospitalization No Has patient had a PCN reaction occurring within the last 10 years: No If all of the above answers are "NO", then may proceed with Cephalosporin use.   Numbness of lips    Objective:   BP 118/76 (BP Location: Right Arm, Patient Position: Sitting)   Pulse 94   Temp 97.8 F (36.6 C) (Temporal)   Ht 5\' 9"  (1.753 m)   Wt 193 lb (87.5 kg)   SpO2 94%   BMI 28.50 kg/m    Physical Exam Vitals reviewed.  Constitutional:      General: He is not in acute distress.    Appearance: Normal appearance. He is obese. He is not ill-appearing, toxic-appearing or diaphoretic.  HENT:     Head: Normocephalic.     Right Ear: Tympanic membrane normal.     Left Ear: Tympanic membrane normal.     Nose: Nose normal.     Mouth/Throat:     Mouth: Mucous membranes are moist.     Pharynx: Posterior oropharyngeal erythema and postnasal drip present.  Eyes:     Pupils: Pupils are equal, round, and reactive to light.  Cardiovascular:     Rate and Rhythm: Normal rate and regular rhythm.  Pulmonary:     Effort: Pulmonary effort is normal.     Breath sounds: Normal breath sounds. No wheezing.  Musculoskeletal:        General: Normal range  of motion.     Cervical back: Normal range of motion.  Neurological:     General: No focal deficit present.     Mental Status: He is alert and oriented to person, place, and time. Mental status is at baseline.  Psychiatric:        Mood and Affect: Mood normal.        Behavior: Behavior normal.        Thought Content: Thought content normal.        Judgment: Judgment normal.     Assessment & Plan:  Viral upper respiratory infection Assessment & Plan: Strep negative Advised patient on supportive measures:  Be sure to rest, drink plenty of fluids, and use tylenol or ibuprofen as needed for pain. Follow up if fever >101, if symptoms worsen or if symptoms are not improved in 3 days. Patient verbalizes understanding.      Acute cough -     Benzonatate; Take 1 capsule (200 mg total) by mouth 2 (two) times daily as needed for cough.  Dispense: 20 capsule; Refill: 0 -     POCT rapid strep A -     Promethazine-DM; Take 5 mLs by mouth 4 (four) times daily as needed for up to 5 days for cough.  Dispense: 100 mL; Refill: 0     Follow up plan: Return for f/u PCP if no improvement in symptoms.  Mort Sawyers, FNP

## 2024-06-03 ENCOUNTER — Encounter: Payer: Self-pay | Admitting: Internal Medicine

## 2024-06-03 ENCOUNTER — Encounter: Payer: Self-pay | Admitting: Family Medicine

## 2024-06-03 ENCOUNTER — Ambulatory Visit (INDEPENDENT_AMBULATORY_CARE_PROVIDER_SITE_OTHER): Admitting: Internal Medicine

## 2024-06-03 ENCOUNTER — Ambulatory Visit: Payer: Self-pay | Admitting: Internal Medicine

## 2024-06-03 VITALS — BP 112/80 | HR 67 | Temp 97.9°F | Ht 69.0 in | Wt 190.0 lb

## 2024-06-03 DIAGNOSIS — R55 Syncope and collapse: Secondary | ICD-10-CM | POA: Insufficient documentation

## 2024-06-03 LAB — COMPREHENSIVE METABOLIC PANEL WITH GFR
ALT: 33 U/L (ref 0–53)
AST: 20 U/L (ref 0–37)
Albumin: 4.4 g/dL (ref 3.5–5.2)
Alkaline Phosphatase: 65 U/L (ref 39–117)
BUN: 7 mg/dL (ref 6–23)
CO2: 30 meq/L (ref 19–32)
Calcium: 9.5 mg/dL (ref 8.4–10.5)
Chloride: 103 meq/L (ref 96–112)
Creatinine, Ser: 0.97 mg/dL (ref 0.40–1.50)
GFR: 98.54 mL/min (ref >60.00)
Glucose, Bld: 96 mg/dL (ref 70–99)
Potassium: 4.5 meq/L (ref 3.5–5.1)
Sodium: 141 meq/L (ref 135–145)
Total Bilirubin: 0.4 mg/dL (ref 0.2–1.2)
Total Protein: 6.9 g/dL (ref 6.0–8.3)

## 2024-06-03 LAB — TSH: TSH: 3.44 u[IU]/mL (ref 0.35–5.50)

## 2024-06-03 LAB — CBC
HCT: 43 % (ref 39.0–52.0)
Hemoglobin: 14.5 g/dL (ref 13.0–17.0)
MCHC: 33.8 g/dL (ref 30.0–36.0)
MCV: 87.3 fl (ref 78.0–100.0)
Platelets: 217 10*3/uL (ref 150.0–400.0)
RBC: 4.93 Mil/uL (ref 4.22–5.81)
RDW: 13.5 % (ref 11.5–15.5)
WBC: 5.9 10*3/uL (ref 4.0–10.5)
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
## 2024-06-03 NOTE — Assessment & Plan Note (Addendum)
 Seems like typical vasovagal spell Will check EKG and labs--to be sure no worrisome findings Discussed getting supine to avoid passing out with premonitory symptoms  EKG--sinus at 62. Normal axis and intervals. No hypertrophy or ischemia (normal---no comparison) Send for labs

## 2025-06-12 ENCOUNTER — Encounter: Admitting: Family Medicine
